# Patient Record
Sex: Female | Born: 1950
Health system: Southern US, Community
[De-identification: ages and names within clinical notes are randomized; demographics above are authoritative.]

## PROBLEM LIST (undated history)

## (undated) DIAGNOSIS — Z8619 Personal history of other infectious and parasitic diseases: Secondary | ICD-10-CM

## (undated) DIAGNOSIS — M1990 Unspecified osteoarthritis, unspecified site: Secondary | ICD-10-CM

## (undated) DIAGNOSIS — E039 Hypothyroidism, unspecified: Secondary | ICD-10-CM

## (undated) DIAGNOSIS — E049 Nontoxic goiter, unspecified: Secondary | ICD-10-CM

## (undated) DIAGNOSIS — I00 Rheumatic fever without heart involvement: Secondary | ICD-10-CM

## (undated) DIAGNOSIS — M7731 Calcaneal spur, right foot: Secondary | ICD-10-CM

## (undated) DIAGNOSIS — D2239 Melanocytic nevi of other parts of face: Secondary | ICD-10-CM

## (undated) DIAGNOSIS — G43909 Migraine, unspecified, not intractable, without status migrainosus: Secondary | ICD-10-CM

## (undated) DIAGNOSIS — H43819 Vitreous degeneration, unspecified eye: Secondary | ICD-10-CM

## (undated) DIAGNOSIS — R42 Dizziness and giddiness: Secondary | ICD-10-CM

## (undated) HISTORY — DX: Vitreous degeneration, unspecified eye: H43.819

## (undated) HISTORY — DX: Melanocytic nevi of other parts of face: D22.39

## (undated) HISTORY — DX: Personal history of other infectious and parasitic diseases: Z86.19

## (undated) HISTORY — PX: TONSILLECTOMY AND ADENOIDECTOMY: SUR1326

## (undated) HISTORY — DX: Hypothyroidism, unspecified: E03.9

## (undated) HISTORY — DX: Nontoxic goiter, unspecified: E04.9

## (undated) HISTORY — DX: Migraine, unspecified, not intractable, without status migrainosus: G43.909

## (undated) HISTORY — DX: Rheumatic fever without heart involvement: I00

---

## 2007-08-09 HISTORY — PX: COLONOSCOPY: SHX174

## 2014-08-08 DIAGNOSIS — D2239 Melanocytic nevi of other parts of face: Secondary | ICD-10-CM

## 2014-08-08 HISTORY — DX: Melanocytic nevi of other parts of face: D22.39

## 2014-08-15 ENCOUNTER — Encounter: Payer: Self-pay | Admitting: Internal Medicine

## 2014-08-15 ENCOUNTER — Ambulatory Visit (INDEPENDENT_AMBULATORY_CARE_PROVIDER_SITE_OTHER): Payer: BLUE CROSS/BLUE SHIELD | Admitting: Internal Medicine

## 2014-08-15 VITALS — BP 117/70 | HR 60 | Temp 98.0°F | Ht 64.0 in | Wt 148.1 lb

## 2014-08-15 DIAGNOSIS — G43809 Other migraine, not intractable, without status migrainosus: Secondary | ICD-10-CM

## 2014-08-15 DIAGNOSIS — M25561 Pain in right knee: Secondary | ICD-10-CM

## 2014-08-15 DIAGNOSIS — L989 Disorder of the skin and subcutaneous tissue, unspecified: Secondary | ICD-10-CM

## 2014-08-15 DIAGNOSIS — E039 Hypothyroidism, unspecified: Secondary | ICD-10-CM | POA: Insufficient documentation

## 2014-08-15 DIAGNOSIS — G43909 Migraine, unspecified, not intractable, without status migrainosus: Secondary | ICD-10-CM | POA: Insufficient documentation

## 2014-08-15 DIAGNOSIS — E049 Nontoxic goiter, unspecified: Secondary | ICD-10-CM

## 2014-08-15 HISTORY — DX: Nontoxic goiter, unspecified: E04.9

## 2014-08-15 NOTE — Progress Notes (Signed)
Pre visit review using our clinic review tool, if applicable. No additional management support is needed unless otherwise documented below in the visit note. 

## 2014-08-15 NOTE — Patient Instructions (Signed)
Get your blood work before you leave     Please come back to the office in 4- 6 months for a   physical exam. Come back fasting     Will wait for your records

## 2014-08-15 NOTE — Progress Notes (Signed)
Subjective:    Patient ID: Shannon Mendez, female    DOB: Dec 21, 1950, 64 y.o.   MRN: [id number]  DOS:  08/15/2014 Type of visit - description : New patient, here to get established We discussed the following issues today: Has a skin lesion at the forehead, last time was seen by dermatology was about 2 years, since then they area is darker, itchy, bleeds from time to time. Hypothyroidism, good medication compliance One-year history of on and off right leg pain, pain is located at the external aspect of the knee and goes up more to the inner aspect of the knee. Worse with walking, worse with the abduction of the hip. Denies any injury, swelling, redness or warmness History of goiter, see assessment and plan  ROS Denies chest pain, difficulty breathing or lower extremity edema No back pain per se, no pain at the left leg. Denies dysuria, gross hematuria difficulty urinating  Past Medical History  Diagnosis Date  . Migraine   . Rheumatic fever     as a child  . Hypothyroid   . History of chicken pox   . Goiter 08/15/2014    Past Surgical History  Procedure Laterality Date  . Tonsillectomy and adenoidectomy      History   Social History  . Marital Status: Married    Spouse Name: N/A    Number of Children: 2  . Years of Education: N/A   Occupational History  . 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 Smoking status: Never Smoker   . Smokeless tobacco: Never Used  . Alcohol Use: 0.0 oz/week    0 Not specified per week     Comment: Occasional  . Drug Use: No  . Sexual Activity: Not on file   Other Topics Concern  . Not on file   Social History Narrative   Son 107, daughter 44 (she lives in [place])   High school education     Family History  Problem Relation Age of Onset  . Colon cancer Neg Hx   . Cervical cancer Neg Hx   . Ovarian cancer Mother     in her 34s  . Breast cancer Neg Hx   . CAD Neg Hx   . Diabetes  Other     GM ?       Medication List       This list is accurate as of: 08/15/14 11:59 PM.  Always use your most recent med list.               levothyroxine 100 MCG tablet  Commonly known as:  SYNTHROID, LEVOTHROID  Take 100 mcg by mouth daily before breakfast.           Objective:   Physical Exam  HENT:  Head:    Musculoskeletal:       Legs:  BP 117/70 mmHg  Pulse 60  Temp(Src) 98 F (36.7 C) (Oral)  Ht 5\' 4"  (1.626 m)  Wt 148 lb 2 oz (67.189 kg)  BMI 25.41 kg/m2  SpO2 97% General -- alert, well-developed, NAD.  Neck --+ thyromegaly, gland is symmetric, not nodular or tender HEENT-- Not pale.   Lungs -- normal respiratory effort, no intercostal retractions, no accessory muscle use, and normal breath sounds.  Heart-- normal rate, regular rhythm, no murmur.  Abdomen-- Not distended, good bowel sounds,soft, non-tender. Extremities-- no pretibial edema bilaterally  Knees without swelling, deformities, effusion.  DTRs symmetric and stable. Mild pain at the external aspect of the right knee, mild pain with   abduction of the hip. Neurologic--  alert & oriented X3. Speech normal, gait appropriate for age, strength symmetric and appropriate for age.  Psych-- Cognition and judgment appear intact. Cooperative with normal attention span and concentration. No anxious or depressed appearing.        Assessment & Plan:   Declined a flu shot We'll get records from previous doctors Reports a Pap smear April 2015, + Abnormal Paps before Last mammogram 2015 Previous PCP Dr. Reece Leader in [place] Used to see gynecology

## 2014-08-15 NOTE — Assessment & Plan Note (Signed)
Suspect intrinsic knee problem, meniscal problem? Refer to orthopedic

## 2014-08-15 NOTE — Assessment & Plan Note (Signed)
Long history of goiter, had 2 ultrasounds before and a biopsy which was negative approximately 2011. Plan-- get records.

## 2014-08-15 NOTE — Assessment & Plan Note (Signed)
Continue with Synthroid 100 g, last TSH was approximately 5 months ago, check a TSH, refill as needed

## 2014-08-16 LAB — TSH: TSH: 0.295 u[IU]/mL — ABNORMAL LOW (ref 0.350–4.500)

## 2014-08-16 NOTE — Assessment & Plan Note (Signed)
Skin lesion getting worse over the last 2 years, suspect SK, refer to dermatology for confirmation

## 2014-08-18 MED ORDER — LEVOTHYROXINE SODIUM 88 MCG PO TABS: 88.0000 ug | ORAL_TABLET | Freq: Every day | ORAL | Status: DC

## 2014-08-18 NOTE — Addendum Note (Signed)
Addended by: Wilfrid Lund on: 08/18/2014 03:15 PM   Modules accepted: Orders, Medications

## 2014-09-12 ENCOUNTER — Telehealth: Payer: Self-pay | Admitting: *Deleted

## 2014-09-12 NOTE — Telephone Encounter (Signed)
Received medical records via fax from Dr. Reece Leader. Forwarded to Dr. Larose Kells. JG//CMA

## 2014-09-22 NOTE — Telephone Encounter (Signed)
Records reviewed,only relevant ones will be kept, the rest are going back to the patient for safe keeping. -History of posterior vitreous detachment in 2011 -EKGs scanned  -labs 06-24-13: CBC normal potassium 4.5, creatinine 0.8. Total cholesterol 214, HDL 63, LDL 136. - Per chart review: Colonoscopy 01/25/2008, no actual report ,  next 2016  -History of goiter, used to see endocrinology yearly

## 2014-10-17 ENCOUNTER — Other Ambulatory Visit: Payer: Self-pay

## 2014-10-17 MED ORDER — LEVOTHYROXINE SODIUM 88 MCG PO TABS: 88.0000 ug | ORAL_TABLET | Freq: Every day | ORAL | Status: DC

## 2014-11-20 ENCOUNTER — Ambulatory Visit (INDEPENDENT_AMBULATORY_CARE_PROVIDER_SITE_OTHER): Payer: BLUE CROSS/BLUE SHIELD | Admitting: Internal Medicine

## 2014-11-20 ENCOUNTER — Other Ambulatory Visit: Payer: Self-pay

## 2014-11-20 ENCOUNTER — Encounter: Payer: Self-pay | Admitting: Internal Medicine

## 2014-11-20 VITALS — BP 118/78 | HR 64 | Temp 98.0°F | Ht 64.0 in | Wt 151.5 lb

## 2014-11-20 DIAGNOSIS — M25561 Pain in right knee: Secondary | ICD-10-CM

## 2014-11-20 DIAGNOSIS — L989 Disorder of the skin and subcutaneous tissue, unspecified: Secondary | ICD-10-CM | POA: Diagnosis not present

## 2014-11-20 DIAGNOSIS — E049 Nontoxic goiter, unspecified: Secondary | ICD-10-CM | POA: Diagnosis not present

## 2014-11-20 DIAGNOSIS — E039 Hypothyroidism, unspecified: Secondary | ICD-10-CM

## 2014-11-20 DIAGNOSIS — Z0000 Encounter for general adult medical examination without abnormal findings: Secondary | ICD-10-CM

## 2014-11-20 NOTE — Progress Notes (Signed)
Pre visit review using our clinic review tool, if applicable. No additional management support is needed unless otherwise documented below in the visit note. 

## 2014-11-20 NOTE — Patient Instructions (Signed)
Get your blood work before you leave    Come back to the office 6 months  for a physical exam  Please schedule an appointment at the front desk    Come back fasting

## 2014-11-20 NOTE — Progress Notes (Signed)
Subjective:    Patient ID: Shannon Mendez, female    DOB: 05-Feb-1951, 64 y.o.   MRN: [id number]  DOS:  11/20/2014 Type of visit - description : rov Interval history: Hypothyroidism, based on the last TSH, Synthroid dose adjusted, good compliance. Was seen with knee pain, doing better Was seen recently with skin lesion, better.    Review of Systems In general feeling well, denies any pain or swelling at the thyroid area. She has goiter, the area doesn't seem any different than before. Previous records from her other doctor reviewed and discuss with the patient today.  Past Medical History  Diagnosis Date  . Migraine   . Rheumatic fever     as a child  . Hypothyroid   . History of chicken pox   . Goiter 08/15/2014  . Vitreous detachment     2011    Past Surgical History  Procedure Laterality Date  . Tonsillectomy and adenoidectomy      History   Social History  . Marital Status: Married    Spouse Name: N/A  . Number of Children: 2  . Years of Education: N/A   Occupational History  . 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 Smoking status: Never Smoker   . Smokeless tobacco: Never Used  . Alcohol Use: 0.0 oz/week    0 Standard drinks or equivalent per week     Comment: Occasional  . Drug Use: No  . Sexual Activity: Not on file   Other Topics Concern  . Not on file   Social History Narrative   Son 70, daughter 80 (she lives in [place])   High school education        Medication List       This list is accurate as of: 11/20/14 11:59 PM.  Always use your most recent med list.               levothyroxine 88 MCG tablet  Commonly known as:  SYNTHROID  Take 1 tablet (88 mcg total) by mouth daily before breakfast.     meloxicam 15 MG tablet  Commonly known as:  MOBIC  Take 15 mg by mouth daily as needed for pain. Dr. Marjo Bicker           Objective:   Physical Exam BP 118/78 mmHg  Pulse 64  Temp(Src)  98 F (36.7 C) (Oral)  Ht 5\' 4"  (1.626 m)  Wt 151 lb 8 oz (68.72 kg)  BMI 25.99 kg/m2  SpO2 98% General:   Well developed, well nourished . NAD.  HEENT:  Normocephalic . Face symmetric, atraumatic Skin forehead: Normal Neck: Thyroid gland is slightly enlarge, worse on the right? Not nodular or tender Lungs:  CTA B Normal respiratory effort, no intercostal retractions, no accessory muscle use. Heart: RRR,  no murmur.  Muscle skeletal: no pretibial edema bilaterally  Skin: Not pale. Not jaundice Neurologic:  alert & oriented X3.  Speech normal, gait appropriate for age and unassisted Psych--  Cognition and judgment appear intact.  Cooperative with normal attention span and concentration.  Behavior appropriate. No anxious or depressed appearing.      Assessment & Plan:    Records reviewed,only relevant ones will be kept, the rest are going back to the patient for safe keeping. -History of posterior vitreous detachment in 2011 -EKGs scanned  -labs 06-24-13: CBC normal potassium 4.5, creatinine 0.8. Total cholesterol 214, HDL 63, LDL  136. - Per chart review: Colonoscopy 01/25/2008, no actual report , next 2016  -History of goiter, used to see endocrinology year Skin lesion, the patient cancelled dermatology referral because the lesion fell off. On exam area is now completely norma

## 2014-11-21 DIAGNOSIS — Z0000 Encounter for general adult medical examination without abnormal findings: Secondary | ICD-10-CM | POA: Insufficient documentation

## 2014-11-21 LAB — TSH: TSH: 0.46 u[IU]/mL (ref 0.35–4.50)

## 2014-11-21 NOTE — Assessment & Plan Note (Signed)
will refer to a female gynecologist Will discuss a colonoscopy when she comes back in 6 months

## 2014-11-21 NOTE — Assessment & Plan Note (Signed)
Saw orthopedic doctor, had few meloxicam, feeling better

## 2014-11-21 NOTE — Assessment & Plan Note (Signed)
Hypothyroidism She has a history of hypothyroidism and also goiter. Had 1 biopsy of the thyroid before and he was negative for malignancy. Status post approximately 3 ultrasounds, the last 1 was 3 years ago. We agreed to check a ultrasound

## 2014-11-21 NOTE — Assessment & Plan Note (Signed)
H/o  1 biopsy of the thyroid before and he was negative for malignancy. Status post approximately 3 ultrasounds, the last ~  3 years ago. We agreed to check a ultrasound

## 2014-11-21 NOTE — Assessment & Plan Note (Signed)
Skin lesion, the patient cancelled dermatology referral because the lesion fell off. On exam area is now completely normal

## 2014-11-24 MED ORDER — LEVOTHYROXINE SODIUM 88 MCG PO TABS: 88.0000 ug | ORAL_TABLET | Freq: Every day | ORAL | Status: DC

## 2014-11-24 NOTE — Addendum Note (Signed)
Addended by: Wilfrid Lund on: 11/24/2014 08:05 AM   Modules accepted: Orders

## 2014-11-25 ENCOUNTER — Ambulatory Visit (HOSPITAL_BASED_OUTPATIENT_CLINIC_OR_DEPARTMENT_OTHER)
Admission: RE | Admit: 2014-11-25 | Discharge: 2014-11-25 | Disposition: A | Discharge status: Home / Self Care | Payer: BLUE CROSS/BLUE SHIELD | Source: Ambulatory Visit | Attending: Internal Medicine | Admitting: Internal Medicine

## 2014-11-25 DIAGNOSIS — E049 Nontoxic goiter, unspecified: Secondary | ICD-10-CM

## 2014-12-19 ENCOUNTER — Telehealth: Payer: Self-pay | Admitting: Internal Medicine

## 2014-12-19 DIAGNOSIS — Z1211 Encounter for screening for malignant neoplasm of colon: Secondary | ICD-10-CM

## 2014-12-19 NOTE — Telephone Encounter (Signed)
Relation to pt: self  Call back number: [phone]   Reason for call:  Pt requesting orders for colonoscopy

## 2014-12-19 NOTE — Telephone Encounter (Signed)
Okay to place orders 

## 2014-12-19 NOTE — Telephone Encounter (Signed)
Referral placed to GI 

## 2014-12-19 NOTE — Telephone Encounter (Signed)
Please arrange a referral to [facility] for a colonoscopy, she is due this year

## 2014-12-29 LAB — HM PAP SMEAR

## 2015-01-01 ENCOUNTER — Telehealth: Payer: Self-pay | Admitting: Internal Medicine

## 2015-01-01 NOTE — Telephone Encounter (Signed)
Caller name: Neesha Relation to pt: self  Call back number: [phone] Pharmacy:  Reason for call:   Patient states that the GI office that we referred her to is requesting Colonoscopy report and images. She states that Dr. Larose Kells has told her that he did receive records but I don't see anything in chart. Does Dr. Larose Kells has these paper records so that I can send to GI. Thanks!

## 2015-01-02 NOTE — Telephone Encounter (Signed)
Per Dr. Larose Kells, he does not have cscope report. If GI needs it, Pt will need to supply it to them from whomever previously did her cscope.

## 2015-01-02 NOTE — Telephone Encounter (Signed)
Please advise 

## 2015-01-02 NOTE — Telephone Encounter (Signed)
See my note from 07-2015: "Per chart review: Colonoscopy 01/25/2008, no actual report , next 2016"  I know she had a colonoscopy but I don't have the actual report. If the actual report is needed we need to contact the GI doc directly

## 2015-01-07 ENCOUNTER — Telehealth: Payer: Self-pay | Admitting: Gastroenterology

## 2015-01-07 NOTE — Telephone Encounter (Signed)
Reviewed colonoscopy report from 01/2008 in [place];  Dr. Almeta Monas.  The report is half hand-written, half typed.  No pictures.  He documented the test was done for "screening" and "chronic constipation" and "pink or red material on toilet paper."  Exam was to the cecum, prep was good.  No polyps were found, she did have hemorrhoids. He recommended repeat colonoscopy in 6 years.  Very unusual interval for repeat examination.  Unless new symptoms have arisen, she is OK for colon cancer screening again in 01/2018 (usual ten year interval).

## 2015-01-08 ENCOUNTER — Telehealth: Payer: Self-pay | Admitting: Internal Medicine

## 2015-01-08 NOTE — Telephone Encounter (Signed)
Noted  

## 2015-01-08 NOTE — Telephone Encounter (Signed)
Caller name: Asa Relation to pt: self Call back number: Pharmacy:  Reason for call:   Patient states that she heard from GI and that they told her that she will be due for a colonoscopy 2019. Just FYI

## 2015-01-19 ENCOUNTER — Telehealth: Payer: Self-pay | Admitting: *Deleted

## 2015-01-19 NOTE — Telephone Encounter (Signed)
Unable to reach patient at time of Pre-Visit Call.  Unable to leave message because phone did not connect to voicemail- rang continuously.

## 2015-01-20 ENCOUNTER — Encounter: Payer: Self-pay | Admitting: Internal Medicine

## 2015-01-20 ENCOUNTER — Ambulatory Visit (INDEPENDENT_AMBULATORY_CARE_PROVIDER_SITE_OTHER): Payer: BLUE CROSS/BLUE SHIELD | Admitting: Internal Medicine

## 2015-01-20 VITALS — BP 126/70 | HR 57 | Temp 98.0°F | Ht 64.0 in | Wt 150.5 lb

## 2015-01-20 DIAGNOSIS — Z23 Encounter for immunization: Secondary | ICD-10-CM

## 2015-01-20 DIAGNOSIS — E049 Nontoxic goiter, unspecified: Secondary | ICD-10-CM

## 2015-01-20 DIAGNOSIS — Z0000 Encounter for general adult medical examination without abnormal findings: Secondary | ICD-10-CM

## 2015-01-20 NOTE — Patient Instructions (Signed)
  Please schedule labs to be done within few days (fasting)   

## 2015-01-20 NOTE — Progress Notes (Signed)
Subjective:    Patient ID: Shannon Mendez, female    DOB: 01-30-1951, 64 y.o.   MRN: [id number]  DOS:  01/20/2015 Type of visit - description : cpx Interval history:  Patient is a 64 year old female with history of hypothyroidism in today for routine medical care.  Hypothyroidism: Patient notes good compliance with medications and denies any symptoms of hypo-/hyperthyroidism. Denies side effects from medications.  Knee Pain: Currently taking Mobic prn for knee pain with good relief. No side effects from medication.   Dizziness: Notes occasional episodes of tunnel vision without syncope usually in the context of several hours of looking at the computer monitor. Denies vertigo. Is not accompanied by any other symptoms like blurred vision or constitutional symptoms like sweating/chills/fever  Review of Systems  Constitutional: No fever. No chills. No unexplained wt changes. No unusual sweats  HEENT: No dental problems, no ear discharge, no facial swelling, no voice changes. No eye discharge, no eye  redness , no  intolerance to light   Respiratory: No wheezing, no difficulty breathing. No cough, no mucus production  Cardiovascular: No CP, no leg swelling , no  Palpitations  GI: no nausea, no vomiting, no diarrhea , no  abdominal pain.  No blood in the stools. No dysphagia, no odynophagia    Endocrine: No polyphagia, no polyuria , no polydipsia  GU: No dysuria, gross hematuria, difficulty urinating. No urinary urgency, no frequency.  Musculoskeletal: No joint swellings or unusual aches or pains  Skin: No change in the color of the skin, pallor, no rash  Allergic, immunologic: No environmental allergies, no food allergies  Neurological: No syncope. No headaches. No diplopia, no slurred, no slurred speech, no motor deficits, no facial numbness. Occ dizziness.  Hematological: No enlarged lymph nodes, no easy bruising , no unusual bleedings  Psychiatry: No suicidal ideas, no  hallucinations, no beavior problems, no confusion.  No unusual/severe anxiety, no depression    Past Medical History  Diagnosis Date  . Migraine   . Rheumatic fever     as a child  . Hypothyroid   . History of chicken pox   . Goiter 08/15/2014  . Vitreous detachment     2011    Past Surgical History  Procedure Laterality Date  . Tonsillectomy and adenoidectomy      History   Social History  . Marital Status: Married    Spouse Name: N/A  . Number of Children: 2  . Years of Education: N/A   Occupational History  . 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 Smoking status: Never Smoker   . Smokeless tobacco: Never Used  . Alcohol Use: 0.0 oz/week    0 Standard drinks or equivalent per week     Comment: Occasional  . Drug Use: No  . Sexual Activity: Not on file   Other Topics Concern  . Not on file   Social History Narrative   Son 84, daughter 25 (she lives in [place])   High school education     Family History  Problem Relation Age of Onset  . Colon cancer Neg Hx   . Cervical cancer Neg Hx   . Ovarian cancer Mother     in her 36s  . Breast cancer Neg Hx   . CAD Neg Hx   . Diabetes Other     GM ?      Medication List  This list is accurate as of: 01/20/15 11:59 PM.  Always use your most recent med list.               levothyroxine 88 MCG tablet  Commonly known as:  SYNTHROID  Take 1 tablet (88 mcg total) by mouth daily before breakfast.     meloxicam 15 MG tablet  Commonly known as:  MOBIC  Take 15 mg by mouth daily as needed for pain. Dr. Marjo Bicker           Objective:   Physical Exam BP 126/70 mmHg  Pulse 57  Temp(Src) 98 F (36.7 C) (Oral)  Ht 5\' 4"  (1.626 m)  Wt 150 lb 8 oz (68.266 kg)  BMI 25.82 kg/m2  SpO2 97%  General:   Well developed, well nourished . NAD.  Neck:  Full range of motion. Supple. HEENT:  Normocephalic. Face symmetric, atraumatic. Thyromegaly noted. Lungs:   CTA B Normal respiratory effort, no intercostal retractions, no accessory muscle use. Heart: RRR,  no murmur.  No pretibial edema bilaterally  Abdomen:  Not distended, soft, non-tender. No rebound or rigidity. No mass,organomegaly Skin: Exposed areas without rash. Not pale. Not jaundice Neurologic:  alert & oriented X3.  Speech normal, gait appropriate for age and unassisted Psych: Cognition and judgment appear intact.  Cooperative with normal attention span and concentration.  Behavior appropriate. No anxious or depressed appearing.     Assessment & Plan:   (Patient seen along with   Aletta Edouard, medical student)  Hypothyroidism: Well-controlled with synthroid, continue current regimen of medication. Will check labs today.  Knee Pain: Well-controlled with Meloxicam prn, patient to let us know if symptoms worsen.  Dizziness:  Patient notes occasional episodes of tunnel vision which are not accompanied by vertigo, syncope, or double vision. These occur most often while she is sitting at chair. Plan: Will observe condition, patient instructed to let us know if symptoms become more frequent or severe.

## 2015-01-20 NOTE — Progress Notes (Signed)
Pre visit review using our clinic review tool, if applicable. No additional management support is needed unless otherwise documented below in the visit note. 

## 2015-01-21 NOTE — Assessment & Plan Note (Signed)
Tetanus today EKG today normal Cscope 2009, no polyps, next cscope due 2019  Saw gyn - had pap smear (-) , is scheduled to return 01/28/15 for MMG and BMD  Labs - BMP/LFTs/FLP/CBC/TSH Diet and exercise discussed

## 2015-01-21 NOTE — Assessment & Plan Note (Signed)
Ultrasound 11/2014 stable 

## 2015-01-22 ENCOUNTER — Other Ambulatory Visit (INDEPENDENT_AMBULATORY_CARE_PROVIDER_SITE_OTHER): Payer: BLUE CROSS/BLUE SHIELD

## 2015-01-22 DIAGNOSIS — Z0000 Encounter for general adult medical examination without abnormal findings: Secondary | ICD-10-CM

## 2015-01-22 LAB — CBC WITH DIFFERENTIAL/PLATELET
BASOS PCT: 0.8 % (ref 0.0–3.0)
Basophils Absolute: 0.1 10*3/uL (ref 0.0–0.1)
EOS PCT: 1.3 % (ref 0.0–5.0)
Eosinophils Absolute: 0.1 10*3/uL (ref 0.0–0.7)
HCT: 40.7 % (ref 36.0–46.0)
Hemoglobin: 13.9 g/dL (ref 12.0–15.0)
LYMPHS ABS: 1.9 10*3/uL (ref 0.7–4.0)
Lymphocytes Relative: 25.5 % (ref 12.0–46.0)
MCHC: 34.1 g/dL (ref 30.0–36.0)
MCV: 90.4 fl (ref 78.0–100.0)
MONO ABS: 0.6 10*3/uL (ref 0.1–1.0)
MONOS PCT: 8.5 % (ref 3.0–12.0)
NEUTROS ABS: 4.8 10*3/uL (ref 1.4–7.7)
Neutrophils Relative %: 63.9 % (ref 43.0–77.0)
PLATELETS: 247 10*3/uL (ref 150.0–400.0)
RBC: 4.5 Mil/uL (ref 3.87–5.11)
RDW: 12.9 % (ref 11.5–15.5)
WBC: 7.5 10*3/uL (ref 4.0–10.5)

## 2015-01-22 LAB — COMPREHENSIVE METABOLIC PANEL
ALT: 21 U/L (ref 0–35)
AST: 20 U/L (ref 0–37)
Albumin: 4.1 g/dL (ref 3.5–5.2)
Alkaline Phosphatase: 98 U/L (ref 39–117)
BILIRUBIN TOTAL: 0.6 mg/dL (ref 0.2–1.2)
BUN: 18 mg/dL (ref 6–23)
CO2: 28 mEq/L (ref 19–32)
Calcium: 9.1 mg/dL (ref 8.4–10.5)
Chloride: 107 mEq/L (ref 96–112)
Creatinine, Ser: 0.96 mg/dL (ref 0.40–1.20)
GFR: 62.47 mL/min (ref >60.00)
Glucose, Bld: 99 mg/dL (ref 70–99)
Potassium: 4.4 mEq/L (ref 3.5–5.1)
Sodium: 140 mEq/L (ref 135–145)
Total Protein: 6.7 g/dL (ref 6.0–8.3)

## 2015-01-22 LAB — LIPID PANEL
Cholesterol: 213 mg/dL — ABNORMAL HIGH (ref 0–200)
HDL: 47.1 mg/dL (ref >39.00)
LDL Cholesterol: 137 mg/dL — ABNORMAL HIGH (ref 0–99)
NONHDL: 165.9
Total CHOL/HDL Ratio: 5
Triglycerides: 143 mg/dL (ref 0.0–149.0)
VLDL: 28.6 mg/dL (ref 0.0–40.0)

## 2015-01-22 LAB — TSH: TSH: 0.65 u[IU]/mL (ref 0.35–4.50)

## 2015-05-27 ENCOUNTER — Encounter: Payer: BLUE CROSS/BLUE SHIELD | Admitting: Internal Medicine

## 2015-06-13 ENCOUNTER — Other Ambulatory Visit: Payer: Self-pay | Admitting: Internal Medicine

## 2015-06-17 ENCOUNTER — Telehealth: Payer: Self-pay | Admitting: Internal Medicine

## 2015-06-17 MED ORDER — LEVOTHYROXINE SODIUM 88 MCG PO TABS: 88.0000 ug | ORAL_TABLET | Freq: Every day | ORAL | Status: DC

## 2015-06-17 NOTE — Telephone Encounter (Signed)
Rx sent to [facility] on [address], #90 and 2 refills.

## 2015-06-17 NOTE — Telephone Encounter (Signed)
°  Relation to YO:FVWA Call back number:[phone] Pharmacy:[facility]  Reason for call: pt states per her last conversation with dr.paz she explained to him that she no longer has coverage and she would still need to get her thyroid meds, pt has switched to [facility] because its cheaper, and need dr. Larose Kells to send in her rx levothyroxine (SYNTHROID, LEVOTHROID) 88 MCG tablet pt is requesting a 90 day supply.

## 2016-07-04 ENCOUNTER — Telehealth: Payer: Self-pay | Admitting: Internal Medicine

## 2016-07-04 DIAGNOSIS — E039 Hypothyroidism, unspecified: Secondary | ICD-10-CM

## 2016-07-04 MED ORDER — LEVOTHYROXINE SODIUM 88 MCG PO TABS: 88.0000 ug | ORAL_TABLET | Freq: Every day | ORAL | 0 refills | Status: DC

## 2016-07-04 NOTE — Telephone Encounter (Signed)
Caller name: Leoni  Relation to pt: self  Call back number: [phone] Pharmacy: [place], [place]  Reason for call: Pt is needing refill on levothyroxine (SYNTHROID, LEVOTHROID) 88 MCG tablet. Pt states will set up a CPE appt for next year since she is waiting in having insurance for the visit. Please advise.

## 2016-07-04 NOTE — Telephone Encounter (Signed)
30 day supply sent. Pt at least needs to come in and have TSH checked for correct dosage. Last TSH was checked 01/2015.

## 2016-09-19 ENCOUNTER — Encounter: Payer: Self-pay | Admitting: Internal Medicine

## 2016-09-19 ENCOUNTER — Ambulatory Visit (INDEPENDENT_AMBULATORY_CARE_PROVIDER_SITE_OTHER): Payer: Self-pay | Admitting: Internal Medicine

## 2016-09-19 VITALS — BP 108/68 | HR 58 | Temp 97.8°F | Resp 12 | Ht 64.0 in | Wt 153.1 lb

## 2016-09-19 DIAGNOSIS — Z09 Encounter for follow-up examination after completed treatment for conditions other than malignant neoplasm: Secondary | ICD-10-CM | POA: Insufficient documentation

## 2016-09-19 DIAGNOSIS — E039 Hypothyroidism, unspecified: Secondary | ICD-10-CM

## 2016-09-19 LAB — TSH: TSH: 3.06 u[IU]/mL (ref 0.35–4.50)

## 2016-09-19 MED ORDER — LEVOTHYROXINE SODIUM 88 MCG PO TABS: 88.0000 ug | ORAL_TABLET | Freq: Every day | ORAL | 5 refills | Status: DC

## 2016-09-19 NOTE — Assessment & Plan Note (Signed)
Hypothyroidism: Refill medications, check a TSH Goiter: Stable clinically. Will get a ultrasound when she has insurance. See below. Patient reports has no insurance reason why she has not been able to come regularly, will get coverage in few months and plans to come back for a CPX. For now request to limit labs to only the strictly necessary RTC CPX @ her convenience

## 2016-09-19 NOTE — Progress Notes (Signed)
Pre visit review using our clinic review tool, if applicable. No additional management support is needed unless otherwise documented below in the visit note. 

## 2016-09-19 NOTE — Patient Instructions (Signed)
GO TO THE LAB : Get the blood work     GO TO THE FRONT DESK Schedule your next appointment for a  Physical at your earliest convenience

## 2016-09-19 NOTE — Progress Notes (Signed)
Subjective:    Patient ID: Shannon Mendez, female    DOB: 1951-06-06, 66 y.o.   MRN: [id number]  DOS:  09/19/2016 Type of visit - description : rov Interval history: Hypothyroidism: Good compliance of medications, due for a TSH and refills Goiter: Reports self-examination is unchanged   Review of Systems  Doing great, no nausea, vomiting, diarrhea No chest pain or difficulty breathing  Past Medical History:  Diagnosis Date  . Fibrous papule of nose 2016   Benign  . Goiter 08/15/2014  . History of chicken pox   . Hypothyroid   . Migraine   . Rheumatic fever    as a child  . Vitreous detachment    2011    Past Surgical History:  Procedure Laterality Date  . TONSILLECTOMY AND ADENOIDECTOMY      Social History   Social History  . Marital status: Married    Spouse name: N/A  . Number of children: 2  . Years of education: N/A   Occupational History  . 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 Smoking status: Never Smoker  . Smokeless tobacco: Never Used  . Alcohol use 0.0 oz/week     Comment: Occasional  . Drug use: No  . Sexual activity: Not on file   Other Topics Concern  . Not on file   Social History Narrative   Son 36, daughter 75 (she lives in [place])   High school education      Allergies as of 09/19/2016      Reactions   Amoxicillin Hives      Medication List       Accurate as of 09/19/16  9:25 PM. Always use your most recent med list.          levothyroxine 88 MCG tablet Commonly known as:  SYNTHROID, LEVOTHROID Take 1 tablet (88 mcg total) by mouth daily before breakfast.   meloxicam 15 MG tablet Commonly known as:  MOBIC Take 15 mg by mouth daily as needed for pain. Dr. Marjo Bicker          Objective:   Physical Exam BP 108/68 (BP Location: Left Arm, Patient Position: Sitting, Cuff Size: Small)   Pulse (!) 58   Temp 97.8 F (36.6 C) (Oral)   Resp 12   Ht 5\' 4"  (1.626 m)   Wt  153 lb 2 oz (69.5 kg)   SpO2 99%   BMI 26.28 kg/m  General:   Well developed, well nourished . NAD.  HEENT:  Normocephalic . Face symmetric, atraumatic Neck: + Thyromegaly, more noticeable on the right, not nodular or tender. Lungs:  CTA B Normal respiratory effort, no intercostal retractions, no accessory muscle use. Heart: RRR,  no murmur.  No pretibial edema bilaterally  Skin: Not pale. Not jaundice Neurologic:  alert & oriented X3.  Speech normal, gait appropriate for age and unassisted Psych--  Cognition and judgment appear intact.  Cooperative with normal attention span and concentration.  Behavior appropriate. No anxious or depressed appearing.      Assessment & Plan:   Assessment Hypothyroidism Goiter: s/p USs in [place], last ~ 2013, s/p bx ~ 2011 (-) h/o migraines Vitreous detachment 2011 Rheumatic fever as a child  PLAN: Hypothyroidism: Refill medications, check a TSH Goiter: Stable clinically. Will get a ultrasound when she has insurance. See below. Patient reports has no insurance reason why she has not been able to come regularly, will  get coverage in few months and plans to come back for a CPX. For now request to limit labs to only the strictly necessary RTC CPX @ her convenience II

## 2017-01-19 ENCOUNTER — Encounter: Payer: Self-pay | Admitting: Family Medicine

## 2017-01-19 ENCOUNTER — Ambulatory Visit (INDEPENDENT_AMBULATORY_CARE_PROVIDER_SITE_OTHER): Payer: Self-pay | Admitting: Family Medicine

## 2017-01-19 VITALS — BP 108/60 | HR 60 | Temp 98.1°F | Ht 64.0 in | Wt 149.6 lb

## 2017-01-19 DIAGNOSIS — R350 Frequency of micturition: Secondary | ICD-10-CM

## 2017-01-19 DIAGNOSIS — N3001 Acute cystitis with hematuria: Secondary | ICD-10-CM

## 2017-01-19 LAB — POC URINALSYSI DIPSTICK (AUTOMATED)
Bilirubin, UA: NEGATIVE
Glucose, UA: NEGATIVE
KETONES UA: NEGATIVE
Nitrite, UA: NEGATIVE
PH UA: 6 (ref 5.0–8.0)
PROTEIN UA: NEGATIVE
Spec Grav, UA: >=1.03 — AB (ref 1.010–1.025)
Urobilinogen, UA: 0.2 E.U./dL

## 2017-01-19 MED ORDER — SULFAMETHOXAZOLE-TRIMETHOPRIM 800-160 MG PO TABS: 1.0000 | ORAL_TABLET | Freq: Two times a day (BID) | ORAL | 0 refills | Status: AC

## 2017-01-19 NOTE — Progress Notes (Addendum)
Chief Complaint  Patient presents with  . Urinary Tract Infection    freq urination,burning after urination-sxs 3 days    Shannon Mendez is a 66 y.o. female here for possible UTI.  Duration: 3 days. Symptoms: urinary frequency and dysuria Denies: hematuria, urinary hesitancy, fever, nausea and vomiting, vaginal discharge Hx of recurrent UTI? No  50% improvement since increasing H20 intake.  Denies new sexual partners.   ROS:  Constitutional: denies fever GU: As noted in HPI MSK: Denies back pain Abd: Denies constipation or abdominal pain  Past Medical History:  Diagnosis Date  . Fibrous papule of nose 2016   Benign  . Goiter 08/15/2014  . History of chicken pox   . Hypothyroid   . Migraine   . Rheumatic fever    as a child  . Vitreous detachment    2011   Family History  Problem Relation Age of Onset  . Ovarian cancer Mother        in her 62s  . Diabetes Other        GM ?  Marland Kitchen Colon cancer Neg Hx   . Cervical cancer Neg Hx   . Breast cancer Neg Hx   . CAD Neg Hx    Social History   Social History  . Marital status: Married   Occupational History  . 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 Smoking status: Never Smoker  . Smokeless tobacco: Never Used  . Alcohol use 0.0 oz/week     Comment: Occasional  . Drug use: No   Social History Narrative   Son 23, daughter 22 (she lives in [place])   High school education    BP 108/60 (BP Location: Left Arm, Patient Position: Sitting, Cuff Size: Normal)   Pulse 60   Temp 98.1 F (36.7 C) (Oral)   Ht 5\' 4"  (1.626 m)   Wt 149 lb 9.6 oz (67.9 kg)   SpO2 99%   BMI 25.68 kg/m  General: Awake, alert, appears stated age HEENT: MMM Heart: RRR Lungs: CTAB, normal respiratory effort, no accessory muscle usage Abd: BS+, soft, NT, ND, no masses or organomegaly MSK: No CVA tenderness, neg Lloyd's sign Psych: Age appropriate judgment and insight  Acute cystitis with  hematuria - Plan: sulfamethoxazole-trimethoprim (BACTRIM DS) 800-160 MG tablet  Frequency of urination - Plan: POCT Urinalysis Dipstick (Automated)  Orders as above. UA showed trace leukocyte esterase, blood. May be clearing issues on own. As she is 50% better, will continue to monitor. If she plateaus or worsens, take abx. Culture sent.  Immediate care if she starts having N/V, flank pain, or fevers. F/u prn. The patient voiced understanding and agreement to the plan.  [place], DO 01/19/17 3:13 PM

## 2017-01-19 NOTE — Addendum Note (Signed)
Addended by: Harl Bowie on: 01/19/2017 04:34 PM   Modules accepted: Orders

## 2017-01-19 NOTE — Patient Instructions (Signed)
Continue to stay well hydrated.  OK to hold off on antibiotic if you continue to improve.   It has been sent in should you plateau or worsen.  Seek care if you start having nausea, vomiting, fevers, or flank pain.

## 2017-01-20 LAB — URINE CULTURE

## 2017-01-24 ENCOUNTER — Telehealth: Payer: Self-pay | Admitting: Internal Medicine

## 2017-01-24 NOTE — Telephone Encounter (Signed)
°  Relation to JK:QASU Call back [phone]   Reason for call:  Patient inquired regarding lab results taken 01/19/17, informed patient   Notes recorded by Shelda Pal, DO on 01/23/2017 at 7:21 AM EDT Let ptk now her culture came back neg. If she picked up her rx a little later and is still taking abx, OK to stop. RTC if still having issues. TY.  Patient voice understanding, no additional questions.

## 2017-01-25 NOTE — Telephone Encounter (Signed)
Pt was in the office today and spoke with [facility] regarding her recent lab results and note.//AB/CMA

## 2017-07-21 DIAGNOSIS — Z1231 Encounter for screening mammogram for malignant neoplasm of breast: Secondary | ICD-10-CM | POA: Diagnosis not present

## 2017-07-21 DIAGNOSIS — Z124 Encounter for screening for malignant neoplasm of cervix: Secondary | ICD-10-CM | POA: Diagnosis not present

## 2017-07-21 DIAGNOSIS — Z6826 Body mass index (BMI) 26.0-26.9, adult: Secondary | ICD-10-CM | POA: Diagnosis not present

## 2017-07-24 LAB — HM PAP SMEAR

## 2017-08-18 ENCOUNTER — Encounter: Payer: Self-pay | Admitting: Internal Medicine

## 2017-08-18 ENCOUNTER — Ambulatory Visit (INDEPENDENT_AMBULATORY_CARE_PROVIDER_SITE_OTHER): Payer: Medicare HMO | Admitting: Internal Medicine

## 2017-08-18 VITALS — BP 124/68 | HR 58 | Temp 98.4°F | Resp 14 | Ht 64.0 in | Wt 151.1 lb

## 2017-08-18 DIAGNOSIS — Z0000 Encounter for general adult medical examination without abnormal findings: Secondary | ICD-10-CM | POA: Diagnosis not present

## 2017-08-18 DIAGNOSIS — Z780 Asymptomatic menopausal state: Secondary | ICD-10-CM

## 2017-08-18 DIAGNOSIS — E039 Hypothyroidism, unspecified: Secondary | ICD-10-CM | POA: Diagnosis not present

## 2017-08-18 DIAGNOSIS — Z114 Encounter for screening for human immunodeficiency virus [HIV]: Secondary | ICD-10-CM | POA: Diagnosis not present

## 2017-08-18 DIAGNOSIS — Z1159 Encounter for screening for other viral diseases: Secondary | ICD-10-CM | POA: Diagnosis not present

## 2017-08-18 DIAGNOSIS — R69 Illness, unspecified: Secondary | ICD-10-CM | POA: Diagnosis not present

## 2017-08-18 LAB — CBC WITH DIFFERENTIAL/PLATELET
BASOS PCT: 0.9 % (ref 0.0–3.0)
Basophils Absolute: 0.1 10*3/uL (ref 0.0–0.1)
EOS PCT: 1 % (ref 0.0–5.0)
Eosinophils Absolute: 0.1 10*3/uL (ref 0.0–0.7)
HCT: 42.6 % (ref 36.0–46.0)
Hemoglobin: 14.4 g/dL (ref 12.0–15.0)
LYMPHS ABS: 1.7 10*3/uL (ref 0.7–4.0)
Lymphocytes Relative: 28.1 % (ref 12.0–46.0)
MCHC: 33.8 g/dL (ref 30.0–36.0)
MCV: 93.4 fl (ref 78.0–100.0)
MONO ABS: 0.5 10*3/uL (ref 0.1–1.0)
Monocytes Relative: 7.4 % (ref 3.0–12.0)
NEUTROS ABS: 3.8 10*3/uL (ref 1.4–7.7)
Neutrophils Relative %: 62.6 % (ref 43.0–77.0)
PLATELETS: 253 10*3/uL (ref 150.0–400.0)
RBC: 4.56 Mil/uL (ref 3.87–5.11)
RDW: 13.3 % (ref 11.5–15.5)
WBC: 6.1 10*3/uL (ref 4.0–10.5)

## 2017-08-18 LAB — LIPID PANEL
Cholesterol: 219 mg/dL — ABNORMAL HIGH (ref 0–200)
HDL: 50.4 mg/dL (ref >39.00)
LDL CALC: 143 mg/dL — AB (ref 0–99)
NonHDL: 168.75
TRIGLYCERIDES: 127 mg/dL (ref 0.0–149.0)
Total CHOL/HDL Ratio: 4
VLDL: 25.4 mg/dL (ref 0.0–40.0)

## 2017-08-18 LAB — COMPREHENSIVE METABOLIC PANEL
ALT: 15 U/L (ref 0–35)
AST: 14 U/L (ref 0–37)
Albumin: 4.3 g/dL (ref 3.5–5.2)
Alkaline Phosphatase: 86 U/L (ref 39–117)
BUN: 15 mg/dL (ref 6–23)
CHLORIDE: 104 meq/L (ref 96–112)
CO2: 30 mEq/L (ref 19–32)
Calcium: 9 mg/dL (ref 8.4–10.5)
Creatinine, Ser: 0.87 mg/dL (ref 0.40–1.20)
GFR: 69.42 mL/min (ref >60.00)
Glucose, Bld: 93 mg/dL (ref 70–99)
POTASSIUM: 3.8 meq/L (ref 3.5–5.1)
SODIUM: 140 meq/L (ref 135–145)
Total Bilirubin: 1 mg/dL (ref 0.2–1.2)
Total Protein: 7.4 g/dL (ref 6.0–8.3)

## 2017-08-18 LAB — TSH: TSH: 6.39 u[IU]/mL — ABNORMAL HIGH (ref 0.35–4.50)

## 2017-08-18 NOTE — Patient Instructions (Signed)
GO TO THE LAB : Get the blood work     GO TO THE FRONT DESK Schedule your next appointment for a physical exam in 1 year We call you sooner just for blood work (thyroid)  If the headaches get more intense or frequent please call me.  For the mucus accumulation in the throat: Flonase 2 sprays on each side of the nose daily Omeprazole 20 mg OTC: 1 before breakfast Do that for 6 weeks, if you are not improving, let me know.

## 2017-08-18 NOTE — Assessment & Plan Note (Addendum)
-   Td 2016: Due for a flu shot, Prevnar, Shingrex. Declined all, benefits discussed  -CCS: Cscope 2009, no polyps, next cscope due 2019 - Female care per gyn: PAP and  MMG 2016 ; saw Dr. Linda Hedges  07-21-17, had a MMG, PAP -had a DEXA remotely, ordering one today -Labs: CMP, FLP, TSH, hep C, HIV, CBC

## 2017-08-18 NOTE — Assessment & Plan Note (Signed)
PLAN: Hypothyroidism, goiter: On Synthroid, last [place] w/ no worrisome features.  Check a TSH, consider recheck a [place] next year. History of migraines:  Had migraines with visual aura many years ago, the visual aura has resurface for a year, was having 2-3 episodes a week but now is only once a week.  Typically as soon as she has the aura she takes Tylenol and has been able to prevent progression to HAs. For now we will continue with abortive treatment, if sxs severe or different she is to let me know. Throat congestion: Going on for years, saw ENT in [place] approximately 5 years ago, was told she was okay.  Will do a trial with Flonase, omeprazole.  See instructions. RTC 1 year although she may need a TSH check before next visit

## 2017-08-18 NOTE — Progress Notes (Signed)
Subjective:    Patient ID: Shannon Mendez, female    DOB: 1950-10-30, 67 y.o.   MRN: [id number]  DOS:  08/18/2017 Type of visit - description : cpx Interval history: In general feeling well.  She does have a few concerns, see below.   Review of Systems Several year history of mucus accumulation in the throat in the mornings, she has to clear her throat frequently mostly in the mornings but some in the afternoon. Occasionally she has coughing spells when she clears her throat. No heartburn per se, no postnasal dripping or itchy eyes.  Was seen by ENT before for these sxs.  Also, migraines have resurface.  Denies dizziness, diplopia, slurred speech.  Other than above, a 14 point review of systems is negative     Past Medical History:  Diagnosis Date  . Fibrous papule of nose 2016   Benign  . Goiter 08/15/2014  . History of chicken pox   . Hypothyroid   . Migraine   . Rheumatic fever    as a child  . Vitreous detachment    2011    Past Surgical History:  Procedure Laterality Date  . TONSILLECTOMY AND ADENOIDECTOMY      Social History   Socioeconomic History  . Marital status: Married    Spouse name: Not on file  . Number of children: 2  . Years of education: Not on file  . Highest education level: Not on file  Social Needs  . Financial resource strain: Not on file  . Food insecurity - worry: Not on file  . Food insecurity - inability: Not on file  . Transportation needs - medical: Not on file  . Transportation needs - non-medical: Not on file  Occupational History  . 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 Smoking status: Never Smoker  . Smokeless tobacco: Never Used  Substance and Sexual Activity  . Alcohol use: Yes    Alcohol/week: 0.0 oz    Comment: Occasional  . Drug use: No  . Sexual activity: Not on file  Other Topics Concern  . Not on file  Social History Narrative   Moved from [place] 2014     Has a son and a    daughter (she lives in [place]) they were born in the 10s         Family History  Problem Relation Age of Onset  . Ovarian cancer Mother        in her 1s  . Diabetes Other        GM ?  Marland Kitchen Colon cancer Neg Hx   . Cervical cancer Neg Hx   . Breast cancer Neg Hx   . CAD Neg Hx      Allergies as of 08/18/2017      Reactions   Amoxicillin Hives      Medication List        Accurate as of 08/18/17  4:57 PM. Always use your most recent med list.          levothyroxine 88 MCG tablet Commonly known as:  SYNTHROID, LEVOTHROID Take 1 tablet (88 mcg total) by mouth daily before breakfast.          Objective:   Physical Exam BP 124/68 (BP Location: Right Arm, Patient Position: Sitting, Cuff Size: Small)   Pulse (!) 58   Temp 98.4 F (36.9 C) (Oral)   Resp 14   Ht  5\' 4"  (1.626 m)   Wt 151 lb 2 oz (68.5 kg)   SpO2 98%   BMI 25.94 kg/m  General:   Well developed, well nourished . NAD.  Neck: No  thyromegaly  HEENT:  Normocephalic . Face symmetric, atraumatic.  Nose not congested, sinuses non-TTP Lungs:  CTA B Normal respiratory effort, no intercostal retractions, no accessory muscle use. Heart: RRR,  no murmur.  No pretibial edema bilaterally  Abdomen:  Not distended, soft, non-tender. No rebound or rigidity.   Skin: Exposed areas without rash. Not pale. Not jaundice Neurologic:  alert & oriented X3.  Speech normal, gait appropriate for age and unassisted Strength symmetric and appropriate for age.  Psych: Cognition and judgment appear intact.  Cooperative with normal attention span and concentration.  Behavior appropriate. No anxious or depressed appearing.     Assessment & Plan:    Assessment Hypothyroidism Goiter: s/p USs in [place], last ~ 2013, s/p bx ~ 2011 (-); [place] 2016: slt enlarge w/o worrisome features h/o migraines Vitreous detachment 2011 Rheumatic fever as a child  PLAN: Hypothyroidism, goiter: On Synthroid, last [place] w/ no  worrisome features.  Check a TSH, consider recheck a [place] next year. History of migraines:  Had migraines with visual aura many years ago, the visual aura has resurface for a year, was having 2-3 episodes a week but now is only once a week.  Typically as soon as she has the aura she takes Tylenol and has been able to prevent progression to HAs. For now we will continue with abortive treatment, if sxs severe or different she is to let me know. Throat congestion: Going on for years, saw ENT in [place] approximately 5 years ago, was told she was okay.  Will do a trial with Flonase, omeprazole.  See instructions. RTC 1 year although she may need a TSH check before next visit

## 2017-08-18 NOTE — Progress Notes (Signed)
Pre visit review using our clinic review tool, if applicable. No additional management support is needed unless otherwise documented below in the visit note. 

## 2017-08-19 LAB — HIV ANTIBODY (ROUTINE TESTING W REFLEX): HIV 1&2 Ab, 4th Generation: NONREACTIVE

## 2017-08-19 LAB — HEPATITIS C ANTIBODY
Hepatitis C Ab: NONREACTIVE
SIGNAL TO CUT-OFF: 0.03 (ref <1.00)

## 2017-08-22 MED ORDER — LEVOTHYROXINE SODIUM 100 MCG PO TABS: 100.0000 ug | ORAL_TABLET | Freq: Every day | ORAL | 0 refills | Status: DC

## 2017-08-22 NOTE — Addendum Note (Signed)
Addended byDamita Dunnings D on: 08/22/2017 08:30 AM   Modules accepted: Orders

## 2017-10-02 ENCOUNTER — Ambulatory Visit (INDEPENDENT_AMBULATORY_CARE_PROVIDER_SITE_OTHER): Payer: Medicare HMO | Admitting: Internal Medicine

## 2017-10-02 ENCOUNTER — Encounter: Payer: Self-pay | Admitting: Internal Medicine

## 2017-10-02 ENCOUNTER — Ambulatory Visit (HOSPITAL_BASED_OUTPATIENT_CLINIC_OR_DEPARTMENT_OTHER)
Admission: RE | Admit: 2017-10-02 | Discharge: 2017-10-02 | Disposition: A | Discharge status: Home / Self Care | Payer: Medicare HMO | Source: Ambulatory Visit | Attending: Internal Medicine | Admitting: Internal Medicine

## 2017-10-02 VITALS — BP 116/66 | HR 54 | Temp 97.6°F | Resp 14 | Ht 64.0 in | Wt 152.1 lb

## 2017-10-02 DIAGNOSIS — M25571 Pain in right ankle and joints of right foot: Secondary | ICD-10-CM | POA: Insufficient documentation

## 2017-10-02 DIAGNOSIS — M778 Other enthesopathies, not elsewhere classified: Secondary | ICD-10-CM | POA: Diagnosis not present

## 2017-10-02 DIAGNOSIS — M85852 Other specified disorders of bone density and structure, left thigh: Secondary | ICD-10-CM | POA: Diagnosis not present

## 2017-10-02 DIAGNOSIS — Z780 Asymptomatic menopausal state: Secondary | ICD-10-CM | POA: Diagnosis not present

## 2017-10-02 DIAGNOSIS — M2141 Flat foot [pes planus] (acquired), right foot: Secondary | ICD-10-CM | POA: Diagnosis not present

## 2017-10-02 DIAGNOSIS — M8589 Other specified disorders of bone density and structure, multiple sites: Secondary | ICD-10-CM | POA: Diagnosis not present

## 2017-10-02 DIAGNOSIS — E039 Hypothyroidism, unspecified: Secondary | ICD-10-CM | POA: Diagnosis not present

## 2017-10-02 LAB — TSH: TSH: 1.08 u[IU]/mL (ref 0.35–4.50)

## 2017-10-02 NOTE — Assessment & Plan Note (Signed)
Ankle pain: Unclear etiology, no obvious inflammation, injury.  She does have flat feet. Plan: X-ray, continue with the Ace wrap, occasional Tylenol and ibuprofen.  Call if not improving.  Ice/cold might help >> ok to try. Hypothyroidism: Recheck a TSH.

## 2017-10-02 NOTE — Progress Notes (Signed)
Subjective:    Patient ID: Shannon Mendez, female    DOB: 03/11/1951, 67 y.o.   MRN: [id number]  DOS:  10/02/2017 Type of visit - description : Acute visit, here with her husband Interval history: Developed right ankle pain 10 days ago; pain  only when she walks, denies any injury.  No previous episode like this one, pain is located at the medial aspect of the ankle and radiates upwards with walking. Ace wrap helped to some extent. Due for a TSH   Review of Systems Denies fever chills Ankle has not been red or swollen.  Past Medical History:  Diagnosis Date  . Fibrous papule of nose 2016   Benign  . Goiter 08/15/2014  . History of chicken pox   . Hypothyroid   . Migraine   . Rheumatic fever    as a child  . Vitreous detachment    2011    Past Surgical History:  Procedure Laterality Date  . TONSILLECTOMY AND ADENOIDECTOMY      Social History   Socioeconomic History  . Marital status: Married    Spouse name: Not on file  . Number of children: 2  . Years of education: Not on file  . Highest education level: Not on file  Social Needs  . Financial resource strain: Not on file  . Food insecurity - worry: Not on file  . Food insecurity - inability: Not on file  . Transportation needs - medical: Not on file  . Transportation needs - non-medical: Not on file  Occupational History  . 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 Smoking status: Never Smoker  . Smokeless tobacco: Never Used  Substance and Sexual Activity  . Alcohol use: Yes    Alcohol/week: 0.0 oz    Comment: Occasional  . Drug use: No  . Sexual activity: Not on file  Other Topics Concern  . Not on file  Social History Narrative   Moved from [place] 2014   Has a son and a    daughter (she lives in [place]) they were born in the 5s          Allergies as of 10/02/2017      Reactions   Amoxicillin Hives      Medication List        Accurate as  of 10/02/17  9:42 PM. Always use your most recent med list.          levothyroxine 100 MCG tablet Commonly known as:  SYNTHROID, LEVOTHROID Take 1 tablet (100 mcg total) by mouth daily before breakfast.          Objective:   Physical Exam  Musculoskeletal:       Feet:   BP 116/66 (BP Location: Right Arm, Patient Position: Sitting, Cuff Size: Small)   Pulse (!) 54   Temp 97.6 F (36.4 C) (Oral)   Resp 14   Ht 5\' 4"  (1.626 m)   Wt 152 lb 2 oz (69 kg)   SpO2 97%   BMI 26.11 kg/m  General:   Well developed, well nourished . NAD.  HEENT:  Normocephalic . Face symmetric, atraumatic Ankles, feet: Symmetric, warm, good capillary refills, no swelling, redness, no TTP.  ROM normal.  + Flat food Skin: Not pale. Not jaundice Neurologic:  alert & oriented X3.  Speech normal, gait appropriate for age and unassisted Psych--  Cognition and judgment appear intact.  Cooperative with  normal attention span and concentration.  Behavior appropriate. No anxious or depressed appearing.      Assessment & Plan:    Assessment Hypothyroidism Goiter: s/p USs in [place], last ~ 2013, s/p bx ~ 2011 (-); [place] 2016: slt enlarge w/o worrisome features h/o migraines Vitreous detachment 2011 Rheumatic fever as a child  PLAN: Ankle pain: Unclear etiology, no obvious inflammation, injury.  She does have flat feet. Plan: X-ray, continue with the Ace wrap, occasional Tylenol and ibuprofen.  Call if not improving.  Ice/cold might help >> ok to try. Hypothyroidism: Recheck a TSH.

## 2017-10-02 NOTE — Patient Instructions (Signed)
GO TO THE LAB : Get the blood work    STOP BY THE FIRST FLOOR:  get the XR   Ace wrap Tylenol  500 mg OTC 2 tabs a day every 8 hours as needed for pain  IBUPROFEN (Advil or Motrin) 200 mg 2 tablets every 12 hours as needed for pain.  Always take it with food because may cause gastritis and ulcers.  If you notice nausea, stomach pain, change in the color of stools --->  Stop the medicine and let us know  Call if no better

## 2017-10-02 NOTE — Progress Notes (Signed)
Pre visit review using our clinic review tool, if applicable. No additional management support is needed unless otherwise documented below in the visit note. 

## 2017-10-06 ENCOUNTER — Telehealth: Payer: Self-pay | Admitting: Internal Medicine

## 2017-10-06 NOTE — Telephone Encounter (Signed)
Copied from [place] [id number]. Topic: Quick Communication - See Telephone Encounter >> Oct 06, 2017 12:35 PM Ether Griffins B wrote: CRM for notification. See Telephone encounter for:  Pt calling wanting the results of her xrays  10/06/17.

## 2017-10-06 NOTE — Telephone Encounter (Signed)
Spoke w/ Pt, informed her of results- she is still having pain- offered sports medicine referral- she declined for now. She will wait several more days- if not improving she will call for referral.

## 2017-12-05 ENCOUNTER — Other Ambulatory Visit: Payer: Self-pay | Admitting: Internal Medicine

## 2017-12-06 ENCOUNTER — Encounter: Payer: Self-pay | Admitting: Gastroenterology

## 2018-02-27 DIAGNOSIS — Z880 Allergy status to penicillin: Secondary | ICD-10-CM | POA: Diagnosis not present

## 2018-02-27 DIAGNOSIS — Z833 Family history of diabetes mellitus: Secondary | ICD-10-CM | POA: Diagnosis not present

## 2018-02-27 DIAGNOSIS — Z809 Family history of malignant neoplasm, unspecified: Secondary | ICD-10-CM | POA: Diagnosis not present

## 2018-02-27 DIAGNOSIS — Z8249 Family history of ischemic heart disease and other diseases of the circulatory system: Secondary | ICD-10-CM | POA: Diagnosis not present

## 2018-02-27 DIAGNOSIS — E039 Hypothyroidism, unspecified: Secondary | ICD-10-CM | POA: Diagnosis not present

## 2018-02-27 DIAGNOSIS — Z803 Family history of malignant neoplasm of breast: Secondary | ICD-10-CM | POA: Diagnosis not present

## 2018-07-09 DIAGNOSIS — R69 Illness, unspecified: Secondary | ICD-10-CM | POA: Diagnosis not present

## 2018-07-12 DIAGNOSIS — R69 Illness, unspecified: Secondary | ICD-10-CM | POA: Diagnosis not present

## 2018-07-23 DIAGNOSIS — Z6826 Body mass index (BMI) 26.0-26.9, adult: Secondary | ICD-10-CM | POA: Diagnosis not present

## 2018-07-23 DIAGNOSIS — Z01419 Encounter for gynecological examination (general) (routine) without abnormal findings: Secondary | ICD-10-CM | POA: Diagnosis not present

## 2018-07-23 DIAGNOSIS — Z1231 Encounter for screening mammogram for malignant neoplasm of breast: Secondary | ICD-10-CM | POA: Diagnosis not present

## 2018-07-23 LAB — HM MAMMOGRAPHY

## 2018-08-22 ENCOUNTER — Encounter: Payer: Medicare HMO | Admitting: Internal Medicine

## 2018-09-07 ENCOUNTER — Encounter: Payer: Self-pay | Admitting: Internal Medicine

## 2018-09-07 ENCOUNTER — Ambulatory Visit (INDEPENDENT_AMBULATORY_CARE_PROVIDER_SITE_OTHER): Payer: Medicare HMO | Admitting: Internal Medicine

## 2018-09-07 VITALS — BP 114/64 | HR 61 | Temp 97.8°F | Resp 16 | Ht 64.0 in | Wt 148.1 lb

## 2018-09-07 DIAGNOSIS — E039 Hypothyroidism, unspecified: Secondary | ICD-10-CM

## 2018-09-07 DIAGNOSIS — Z0000 Encounter for general adult medical examination without abnormal findings: Secondary | ICD-10-CM | POA: Diagnosis not present

## 2018-09-07 DIAGNOSIS — Z1211 Encounter for screening for malignant neoplasm of colon: Secondary | ICD-10-CM

## 2018-09-07 DIAGNOSIS — E559 Vitamin D deficiency, unspecified: Secondary | ICD-10-CM | POA: Diagnosis not present

## 2018-09-07 LAB — COMPREHENSIVE METABOLIC PANEL
ALT: 14 U/L (ref 0–35)
AST: 15 U/L (ref 0–37)
Albumin: 4.4 g/dL (ref 3.5–5.2)
Alkaline Phosphatase: 96 U/L (ref 39–117)
BUN: 15 mg/dL (ref 6–23)
CHLORIDE: 105 meq/L (ref 96–112)
CO2: 29 mEq/L (ref 19–32)
Calcium: 9.4 mg/dL (ref 8.4–10.5)
Creatinine, Ser: 0.84 mg/dL (ref 0.40–1.20)
GFR: 67.79 mL/min (ref >60.00)
GLUCOSE: 81 mg/dL (ref 70–99)
POTASSIUM: 5 meq/L (ref 3.5–5.1)
SODIUM: 141 meq/L (ref 135–145)
Total Bilirubin: 0.7 mg/dL (ref 0.2–1.2)
Total Protein: 6.8 g/dL (ref 6.0–8.3)

## 2018-09-07 LAB — CBC WITH DIFFERENTIAL/PLATELET
Basophils Absolute: 0.1 10*3/uL (ref 0.0–0.1)
Basophils Relative: 1 % (ref 0.0–3.0)
EOS ABS: 0.1 10*3/uL (ref 0.0–0.7)
EOS PCT: 1 % (ref 0.0–5.0)
HCT: 42.7 % (ref 36.0–46.0)
HEMOGLOBIN: 14.5 g/dL (ref 12.0–15.0)
LYMPHS ABS: 1.8 10*3/uL (ref 0.7–4.0)
Lymphocytes Relative: 26.2 % (ref 12.0–46.0)
MCHC: 33.9 g/dL (ref 30.0–36.0)
MCV: 90.9 fl (ref 78.0–100.0)
MONO ABS: 0.5 10*3/uL (ref 0.1–1.0)
Monocytes Relative: 7.5 % (ref 3.0–12.0)
NEUTROS PCT: 64.3 % (ref 43.0–77.0)
Neutro Abs: 4.3 10*3/uL (ref 1.4–7.7)
Platelets: 253 10*3/uL (ref 150.0–400.0)
RBC: 4.7 Mil/uL (ref 3.87–5.11)
RDW: 12.9 % (ref 11.5–15.5)
WBC: 6.7 10*3/uL (ref 4.0–10.5)

## 2018-09-07 LAB — TSH: TSH: 0.31 u[IU]/mL — AB (ref 0.35–4.50)

## 2018-09-07 LAB — LIPID PANEL
Cholesterol: 219 mg/dL — ABNORMAL HIGH (ref 0–200)
HDL: 45.5 mg/dL (ref >39.00)
LDL CALC: 139 mg/dL — AB (ref 0–99)
NonHDL: 173.15
Total CHOL/HDL Ratio: 5
Triglycerides: 171 mg/dL — ABNORMAL HIGH (ref 0.0–149.0)
VLDL: 34.2 mg/dL (ref 0.0–40.0)

## 2018-09-07 NOTE — Patient Instructions (Addendum)
Please schedule Medicare Wellness with Glenard Haring.    GO TO THE LAB : Get the blood work     GO TO THE FRONT DESK Schedule your next appointment   for a physical exam in 1 year  Take calcium vitamin D over-the-counter daily. Calcium: 1 g Vitamin D approximately 800 units

## 2018-09-07 NOTE — Assessment & Plan Note (Addendum)
-   Td 2016. Declined all immunizations, benefits discussed  -CCS: Cscope 2009, no polyps, referred to GI - Female care per gyn: Last visit with Dr. Linda Hedges July 24, 2019.  We will try to get records -T score -1.9 (09-2017) not on supplements, recommend to take calcium and vitamin D. - She is active and eating healthy -Labs: CMP, FLP, CBC, TSH, vitamin D

## 2018-09-07 NOTE — Progress Notes (Signed)
Subjective:    Patient ID: Shannon Mendez, female    DOB: 1951/07/15, 68 y.o.   MRN: [id number]  DOS:  09/07/2018 Type of visit - description: CPX No major concerns, doing well.  Review of Systems  A 14 point review of systems is negative    Past Medical History:  Diagnosis Date  . Fibrous papule of nose 2016   Benign  . Goiter 08/15/2014  . History of chicken pox   . Hypothyroid   . Migraine   . Rheumatic fever    as a child  . Vitreous detachment    2011    Past Surgical History:  Procedure Laterality Date  . TONSILLECTOMY AND ADENOIDECTOMY      Social History   Socioeconomic History  . Marital status: Married    Spouse name: Not on file  . Number of children: 2  . Years of education: Not on file  . Highest education level: Not on file  Occupational History  . 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 Financial resource strain: Not on file  . Food insecurity:    Worry: Not on file    Inability: Not on file  . Transportation needs:    Medical: Not on file    Non-medical: Not on file  Tobacco Use  . Smoking status: Never Smoker  . Smokeless tobacco: Never Used  Substance and Sexual Activity  . Alcohol use: Yes    Alcohol/week: 0.0 standard drinks    Comment: Occasional  . Drug use: No  . Sexual activity: Not on file  Lifestyle  . Physical activity:    Days per week: Not on file    Minutes per session: Not on file  . Stress: Not on file  Relationships  . Social connections:    Talks on phone: Not on file    Gets together: Not on file    Attends religious service: Not on file    Active member of club or organization: Not on file    Attends meetings of clubs or organizations: Not on file    Relationship status: Not on file  . Intimate partner violence:    Fear of current or ex partner: Not on file    Emotionally abused: Not on file    Physically abused: Not on file    Forced sexual activity:  Not on file  Other Topics Concern  . Not on file  Social History Narrative   Moved from [place] 2014   Has a son and a  daughter (she lives in [place]) they were born in the 9s         Family History  Problem Relation Age of Onset  . Ovarian cancer Mother        in her 65s  . Diabetes Other        GM ?  . Lung cancer Father   . Aneurysm Father        Aorta A  . Colon cancer Neg Hx   . Cervical cancer Neg Hx   . Breast cancer Neg Hx   . CAD Neg Hx      Allergies as of 09/07/2018      Reactions   Amoxicillin Hives      Medication List       Accurate as of September 07, 2018 11:59 PM. Always use your most recent med list.  levothyroxine 100 MCG tablet Commonly known as:  SYNTHROID, LEVOTHROID Take 1 tablet (100 mcg total) by mouth daily before breakfast.           Objective:   Physical Exam Neck:     BP 114/64 (BP Location: Right Arm, Patient Position: Sitting, Cuff Size: Small)   Pulse 61   Temp 97.8 F (36.6 C) (Oral)   Resp 16   Ht 5\' 4"  (1.626 m)   Wt 148 lb 2 oz (67.2 kg)   SpO2 97%   BMI 25.43 kg/m  General: Well developed, NAD, BMI noted Neck: No  thyromegaly  HEENT:  Normocephalic . Face symmetric, atraumatic Lungs:  CTA B Normal respiratory effort, no intercostal retractions, no accessory muscle use. Heart: RRR,  no murmur.  No pretibial edema bilaterally  Abdomen:  Not distended, soft, non-tender. No rebound or rigidity.   Skin: Exposed areas without rash. Not pale. Not jaundice Neurologic:  alert & oriented X3.  Speech normal, gait appropriate for age and unassisted Strength symmetric and appropriate for age.  Psych: Cognition and judgment appear intact.  Cooperative with normal attention span and concentration.  Behavior appropriate. No anxious or depressed appearing.     Assessment      Assessment Hypothyroidism Goiter: s/p USs in [place], last ~ 2013, s/p bx ~ 2011 (-); [place] 2016: slt enlarge w/o worrisome features h/o  migraines Vitreous detachment 2011 Rheumatic fever as a child  PLAN: Hypothyroidism: Good compliance with meds, check a TSH Goiter: Last ultrasound 2016.  Exam is essentially negative Migraines: Much better in the last 2 to 3 months.  In fact last headache was 2 months ago Vitreous detachment, h/o: Recommend to see the eye doctor yearly Lipoma: See physical exam, lump has been there for 10 years and is not changing.  Recommend self-monitoring. RTC 1 year

## 2018-09-07 NOTE — Progress Notes (Signed)
Pre visit review using our clinic review tool, if applicable. No additional management support is needed unless otherwise documented below in the visit note. 

## 2018-09-08 NOTE — Assessment & Plan Note (Signed)
Hypothyroidism: Good compliance with meds, check a TSH Goiter: Last ultrasound 2016.  Exam is essentially negative Migraines: Much better in the last 2 to 3 months.  In fact last headache was 2 months ago Vitreous detachment, h/o: Recommend to see the eye doctor yearly Lipoma: See physical exam, lump has been there for 10 years and is not changing.  Recommend self-monitoring. RTC 1 year

## 2018-09-10 ENCOUNTER — Encounter: Payer: Self-pay | Admitting: Internal Medicine

## 2018-09-12 LAB — VITAMIN D 1,25 DIHYDROXY
VITAMIN D3 1, 25 (OH): 38 pg/mL
Vitamin D 1, 25 (OH)2 Total: 38 pg/mL (ref 18–72)

## 2018-09-12 MED ORDER — LEVOTHYROXINE SODIUM 88 MCG PO TABS: 88.0000 ug | ORAL_TABLET | Freq: Every day | ORAL | 2 refills | Status: DC

## 2018-09-12 NOTE — Addendum Note (Signed)
Addended byDamita Dunnings D on: 09/12/2018 04:57 PM   Modules accepted: Orders

## 2018-10-03 DIAGNOSIS — Z7289 Other problems related to lifestyle: Secondary | ICD-10-CM | POA: Diagnosis not present

## 2018-10-03 DIAGNOSIS — H6123 Impacted cerumen, bilateral: Secondary | ICD-10-CM | POA: Diagnosis not present

## 2018-10-03 DIAGNOSIS — H93291 Other abnormal auditory perceptions, right ear: Secondary | ICD-10-CM | POA: Diagnosis not present

## 2018-10-24 ENCOUNTER — Other Ambulatory Visit (INDEPENDENT_AMBULATORY_CARE_PROVIDER_SITE_OTHER): Payer: Medicare HMO

## 2018-10-24 ENCOUNTER — Other Ambulatory Visit: Payer: Self-pay

## 2018-10-24 DIAGNOSIS — E039 Hypothyroidism, unspecified: Secondary | ICD-10-CM | POA: Diagnosis not present

## 2018-10-24 LAB — TSH: TSH: 0.32 u[IU]/mL — ABNORMAL LOW (ref 0.35–4.50)

## 2018-10-26 MED ORDER — LEVOTHYROXINE SODIUM 75 MCG PO TABS: 75.0000 ug | ORAL_TABLET | Freq: Every day | ORAL | 0 refills | Status: DC

## 2018-10-26 NOTE — Addendum Note (Signed)
Addended byDamita Dunnings D on: 10/26/2018 08:42 AM   Modules accepted: Orders

## 2018-12-04 ENCOUNTER — Other Ambulatory Visit (INDEPENDENT_AMBULATORY_CARE_PROVIDER_SITE_OTHER): Payer: Medicare HMO

## 2018-12-04 ENCOUNTER — Other Ambulatory Visit: Payer: Self-pay

## 2018-12-04 DIAGNOSIS — E039 Hypothyroidism, unspecified: Secondary | ICD-10-CM

## 2018-12-04 LAB — TSH: TSH: 0.05 u[IU]/mL — ABNORMAL LOW (ref 0.35–4.50)

## 2018-12-07 MED ORDER — LEVOTHYROXINE SODIUM 50 MCG PO TABS: 50.0000 ug | ORAL_TABLET | Freq: Every day | ORAL | 2 refills | Status: DC

## 2018-12-07 NOTE — Addendum Note (Signed)
Addended byDamita Dunnings D on: 12/07/2018 12:44 PM   Modules accepted: Orders

## 2018-12-12 ENCOUNTER — Encounter: Payer: Self-pay | Admitting: Internal Medicine

## 2018-12-12 ENCOUNTER — Telehealth: Payer: Self-pay | Admitting: Internal Medicine

## 2018-12-12 NOTE — Telephone Encounter (Signed)
Left msg for pt to call back

## 2018-12-12 NOTE — Telephone Encounter (Signed)
-----   Message from Colon Branch, MD sent at 12/12/2018  9:51 AM EDT ----- Regarding: Please for sure she has a lab appointment for July, we need to check her TSH

## 2019-02-19 ENCOUNTER — Other Ambulatory Visit (INDEPENDENT_AMBULATORY_CARE_PROVIDER_SITE_OTHER): Payer: Medicare HMO

## 2019-02-19 ENCOUNTER — Other Ambulatory Visit: Payer: Self-pay

## 2019-02-19 DIAGNOSIS — E039 Hypothyroidism, unspecified: Secondary | ICD-10-CM | POA: Diagnosis not present

## 2019-02-19 LAB — TSH: TSH: 2.13 u[IU]/mL (ref 0.35–4.50)

## 2019-02-19 MED ORDER — LEVOTHYROXINE SODIUM 50 MCG PO TABS: 50.0000 ug | ORAL_TABLET | Freq: Every day | ORAL | 1 refills | Status: DC

## 2019-02-19 NOTE — Addendum Note (Signed)
Addended byDamita Dunnings D on: 02/19/2019 04:38 PM   Modules accepted: Orders

## 2019-03-11 ENCOUNTER — Other Ambulatory Visit: Payer: Self-pay

## 2019-03-13 ENCOUNTER — Ambulatory Visit (INDEPENDENT_AMBULATORY_CARE_PROVIDER_SITE_OTHER): Payer: Medicare HMO | Admitting: Internal Medicine

## 2019-03-13 ENCOUNTER — Encounter: Payer: Self-pay | Admitting: Internal Medicine

## 2019-03-13 ENCOUNTER — Other Ambulatory Visit: Payer: Self-pay

## 2019-03-13 VITALS — BP 127/54 | HR 58 | Temp 98.3°F | Resp 16 | Ht 64.0 in | Wt 144.5 lb

## 2019-03-13 DIAGNOSIS — N644 Mastodynia: Secondary | ICD-10-CM

## 2019-03-13 NOTE — Progress Notes (Signed)
Pre visit review using our clinic review tool, if applicable. No additional management support is needed unless otherwise documented below in the visit note. 

## 2019-03-13 NOTE — Patient Instructions (Addendum)
Continue checking your breast from time to time  Get over-the-counter vitamin D 400 units daily for 1 month  If you notice any lump, persistent pain, redness, nipple discharge please let me know

## 2019-03-13 NOTE — Progress Notes (Signed)
Subjective:    Patient ID: Shannon Mendez, female    DOB: 06-27-1951, 68 y.o.   MRN: [id number]  DOS:  03/13/2019 Type of visit - description:  Acute 1 week history of episodic discomfort at the  right breast, inner lower quadrant, the pressure lasts "a few seconds". Self palpation: no lump   Review of Systems Denies fever chills.  No cough No nipple discharge, breast redness or breast injury  Past Medical History:  Diagnosis Date  . Fibrous papule of nose 2016   Benign  . Goiter 08/15/2014  . History of chicken pox   . Hypothyroid   . Migraine   . Rheumatic fever    as a child  . Vitreous detachment    2011    Past Surgical History:  Procedure Laterality Date  . TONSILLECTOMY AND ADENOIDECTOMY      Social History   Socioeconomic History  . Marital status: Married    Spouse name: Not on file  . Number of children: 2  . Years of education: Not on file  . Highest education level: Not on file  Occupational History  . 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 Financial resource strain: Not on file  . Food insecurity    Worry: Not on file    Inability: Not on file  . Transportation needs    Medical: Not on file    Non-medical: Not on file  Tobacco Use  . Smoking status: Never Smoker  . Smokeless tobacco: Never Used  Substance and Sexual Activity  . Alcohol use: Yes    Alcohol/week: 0.0 standard drinks    Comment: Occasional  . Drug use: No  . Sexual activity: Not on file  Lifestyle  . Physical activity    Days per week: Not on file    Minutes per session: Not on file  . Stress: Not on file  Relationships  . Social Herbalist on phone: Not on file    Gets together: Not on file    Attends religious service: Not on file    Active member of club or organization: Not on file    Attends meetings of clubs or organizations: Not on file    Relationship status: Not on file  . Intimate partner  violence    Fear of current or ex partner: Not on file    Emotionally abused: Not on file    Physically abused: Not on file    Forced sexual activity: Not on file  Other Topics Concern  . Not on file  Social History Narrative   Moved from [place] 2014   Has a son and a  daughter (she lives in [place]) they were born in the 21s          Allergies as of 03/13/2019      Reactions   Amoxicillin Hives      Medication List       Accurate as of March 13, 2019 10:41 AM. If you have any questions, ask your nurse or doctor.        levothyroxine 50 MCG tablet Commonly known as: SYNTHROID Take 1 tablet (50 mcg total) by mouth daily before breakfast.           Objective:   Physical Exam BP (!) 127/54 (BP Location: Left Arm, Patient Position: Sitting, Cuff Size: Small)   Pulse (!) 58   Temp 98.3 F (36.8  C) (Oral)   Resp 16   Ht 5\' 4"  (1.626 m)   Wt 144 lb 8 oz (65.5 kg)   SpO2 100%   BMI 24.80 kg/m     General:   Well developed, NAD, BMI noted. HEENT:  Normocephalic . Face symmetric, atraumatic Neck: No mass or lymphadenopathy at the neck or supraclavicular areas Breast and axillary areas: Normal.  Skin normal, nipple normal, no dominant mass, no tenderness, no redness or warmness. Skin: Not pale. Not jaundice Neurologic:  alert & oriented X3.  Speech normal, gait appropriate for age and unassisted Psych--  Cognition and judgment appear intact.  Cooperative with normal attention span and concentration.  Behavior appropriate. No anxious or depressed appearing.   Assessment      Assessment Hypothyroidism Goiter: s/p USs in [place], last ~ 2013, s/p bx ~ 2011 (-); [place] 2016: slt enlarge w/o worrisome features h/o migraines Vitreous detachment 2011 Rheumatic fever as a child  PLAN: R Mastalgia: Self breast exam and clinical breast exam negative.  Update on screening for breast cancer. Recommend OTC vitamin E  X 4 weeks  Call if problem persist, continue with breast  self-awareness. If sxs persitent pt request further eval so I would rx a Dx MMG [place] if needed

## 2019-03-14 NOTE — Assessment & Plan Note (Signed)
R Mastalgia: Self breast exam and clinical breast exam negative.  Update on screening for breast cancer. Recommend OTC vitamin E  X 4 weeks  Call if problem persist, continue with breast self-awareness. If sxs persitent pt request further eval so I would rx a Dx MMG [place] if needed

## 2019-05-21 DIAGNOSIS — H6121 Impacted cerumen, right ear: Secondary | ICD-10-CM | POA: Diagnosis not present

## 2019-05-21 DIAGNOSIS — H9191 Unspecified hearing loss, right ear: Secondary | ICD-10-CM | POA: Diagnosis not present

## 2019-05-21 DIAGNOSIS — Z7289 Other problems related to lifestyle: Secondary | ICD-10-CM | POA: Diagnosis not present

## 2019-05-21 DIAGNOSIS — H938X1 Other specified disorders of right ear: Secondary | ICD-10-CM | POA: Diagnosis not present

## 2019-05-23 ENCOUNTER — Ambulatory Visit (INDEPENDENT_AMBULATORY_CARE_PROVIDER_SITE_OTHER): Payer: Medicare HMO

## 2019-05-23 ENCOUNTER — Other Ambulatory Visit: Payer: Self-pay

## 2019-05-23 DIAGNOSIS — Z23 Encounter for immunization: Secondary | ICD-10-CM | POA: Diagnosis not present

## 2019-05-23 NOTE — Progress Notes (Signed)
Patient was scheduled for pneumovax 23 06-25-2019

## 2019-06-25 ENCOUNTER — Other Ambulatory Visit: Payer: Self-pay

## 2019-06-25 ENCOUNTER — Ambulatory Visit (INDEPENDENT_AMBULATORY_CARE_PROVIDER_SITE_OTHER): Payer: Medicare HMO | Admitting: *Deleted

## 2019-06-25 DIAGNOSIS — Z23 Encounter for immunization: Secondary | ICD-10-CM

## 2019-06-25 NOTE — Progress Notes (Signed)
Patient here for pneumovax vaccine.  Vaccine given in left deltoid and patient tolerated well.

## 2019-09-04 DIAGNOSIS — Z1231 Encounter for screening mammogram for malignant neoplasm of breast: Secondary | ICD-10-CM | POA: Diagnosis not present

## 2019-09-04 DIAGNOSIS — Z124 Encounter for screening for malignant neoplasm of cervix: Secondary | ICD-10-CM | POA: Diagnosis not present

## 2019-09-04 DIAGNOSIS — Z6826 Body mass index (BMI) 26.0-26.9, adult: Secondary | ICD-10-CM | POA: Diagnosis not present

## 2019-09-04 LAB — HM MAMMOGRAPHY

## 2019-09-04 LAB — HM PAP SMEAR

## 2019-09-10 ENCOUNTER — Other Ambulatory Visit: Payer: Self-pay | Admitting: Internal Medicine

## 2019-09-18 ENCOUNTER — Ambulatory Visit (INDEPENDENT_AMBULATORY_CARE_PROVIDER_SITE_OTHER): Payer: Medicare HMO | Admitting: Internal Medicine

## 2019-09-18 ENCOUNTER — Other Ambulatory Visit: Payer: Self-pay

## 2019-09-18 ENCOUNTER — Encounter: Payer: Self-pay | Admitting: Internal Medicine

## 2019-09-18 VITALS — BP 120/59 | HR 56 | Temp 96.1°F | Resp 16 | Ht 64.0 in | Wt 146.2 lb

## 2019-09-18 DIAGNOSIS — E039 Hypothyroidism, unspecified: Secondary | ICD-10-CM

## 2019-09-18 DIAGNOSIS — E049 Nontoxic goiter, unspecified: Secondary | ICD-10-CM

## 2019-09-18 DIAGNOSIS — Z0000 Encounter for general adult medical examination without abnormal findings: Secondary | ICD-10-CM | POA: Diagnosis not present

## 2019-09-18 LAB — CBC WITH DIFFERENTIAL/PLATELET
Basophils Absolute: 0.1 K/uL (ref 0.0–0.1)
Basophils Relative: 1.1 % (ref 0.0–3.0)
Eosinophils Absolute: 0.1 K/uL (ref 0.0–0.7)
Eosinophils Relative: 1.3 % (ref 0.0–5.0)
HCT: 42.1 % (ref 36.0–46.0)
Hemoglobin: 14.3 g/dL (ref 12.0–15.0)
Lymphocytes Relative: 25.7 % (ref 12.0–46.0)
Lymphs Abs: 1.9 K/uL (ref 0.7–4.0)
MCHC: 33.9 g/dL (ref 30.0–36.0)
MCV: 91.9 fl (ref 78.0–100.0)
Monocytes Absolute: 0.5 K/uL (ref 0.1–1.0)
Monocytes Relative: 6.8 % (ref 3.0–12.0)
Neutro Abs: 4.7 K/uL (ref 1.4–7.7)
Neutrophils Relative %: 65.1 % (ref 43.0–77.0)
Platelets: 263 K/uL (ref 150.0–400.0)
RBC: 4.58 Mil/uL (ref 3.87–5.11)
RDW: 13 % (ref 11.5–15.5)
WBC: 7.2 K/uL (ref 4.0–10.5)

## 2019-09-18 LAB — LIPID PANEL
Cholesterol: 229 mg/dL — ABNORMAL HIGH (ref 0–200)
HDL: 51.3 mg/dL (ref >39.00)
LDL Cholesterol: 149 mg/dL — ABNORMAL HIGH (ref 0–99)
NonHDL: 178.11
Total CHOL/HDL Ratio: 4
Triglycerides: 144 mg/dL (ref 0.0–149.0)
VLDL: 28.8 mg/dL (ref 0.0–40.0)

## 2019-09-18 LAB — COMPREHENSIVE METABOLIC PANEL
ALT: 13 U/L (ref 0–35)
AST: 14 U/L (ref 0–37)
Albumin: 4.3 g/dL (ref 3.5–5.2)
Alkaline Phosphatase: 90 U/L (ref 39–117)
BUN: 19 mg/dL (ref 6–23)
CO2: 30 mEq/L (ref 19–32)
Calcium: 9.1 mg/dL (ref 8.4–10.5)
Chloride: 105 mEq/L (ref 96–112)
Creatinine, Ser: 0.9 mg/dL (ref 0.40–1.20)
GFR: 62.41 mL/min (ref >60.00)
Glucose, Bld: 83 mg/dL (ref 70–99)
Potassium: 4.6 mEq/L (ref 3.5–5.1)
Sodium: 140 mEq/L (ref 135–145)
Total Bilirubin: 0.7 mg/dL (ref 0.2–1.2)
Total Protein: 7 g/dL (ref 6.0–8.3)

## 2019-09-18 LAB — TSH: TSH: 6.25 u[IU]/mL — ABNORMAL HIGH (ref 0.35–4.50)

## 2019-09-18 NOTE — Patient Instructions (Addendum)
Please schedule Medicare Wellness with Glenard Haring.   GO TO THE LAB : Get the blood work     GO TO [place] back for a check up in 6 months , please make an appointment   Please go to the first floor and schedule your thyroid ultrasound      We are committed to keeping you informed about the COVID-19 vaccine.  As the vaccine continues to become available for each phase, we will ensure that patients who meet the criteria receive the information they need to access vaccination opportunities. Continue to check your MyChart account and [email or web address] for updates. Please review the Phase 1b information below.  [place] On Tuesday, Jan. 19, the [facility]) and [facility] began large-scale COVID-19 vaccinations at the [facility]. The vaccinations are appointment only and for those 80 and older.  Walk-ins will not be accepted.  All appointments are currently filled. Please join our waiting list for the next available appointments. We will contact you when appointments become available. Please do not sign up more than once.  Join Our Waiting List  Our daily vaccination capacity will continue to increase, including expansion of our [employer] site, increasing mobile clinics across our service region and plans to open COVID-19 vaccination clinics in [place] and [place] counties.  Other Vaccination Opportunities in [place] We are also working in partnership with county health agencies in our service counties to ensure continuing vaccination availability in the weeks and months ahead. Learn more about each county's vaccination efforts in the website links below:   [place]'s phase 1b vaccination guidelines, prioritizing those 65 and over as the next eligible group to receive the  COVID-19 vaccine, are detailed at [email or web address].   Vaccine Safety and Effectiveness Clinical trials for the Pfizer COVID-19 vaccine involved 42,000 people and showed that the vaccine is more than 95% effective in preventing COVID-19 with no serious safety concerns. Similar results have been reported for the Moderna COVID-19 vaccine. Side effects reported in the [place] clinical trials include a sore arm at the injection site, fatigue, headache, chills and fever. While side effects from the [place] COVID-19 vaccine are higher than for a typical flu vaccine, they are lower in many ways than side effects from the leading vaccine to prevent shingles. Side effects are signs that a vaccine is working and are related to your immune system being stimulated to produce antibodies against infection. Side effects from vaccination are far less significant than health impacts from COVID-19.  Staying Informed Pharmacists, infectious disease doctors, critical care nurses and other experts at [facility] continue to speak publicly through media interviews and direct communication with our patients and communities about the safety, effectiveness and importance of vaccines to eliminate COVID-19. In addition, reliable information on vaccine safety, effectiveness, side effects and more is available on the following websites:  [place] Department of Health and Human Services COVID-19 Vaccine Information Website.  U.S. Centers for Disease Control and Prevention [id number] Human resources officer.  Staying Safe We agree with the CDC on what we can do to help our communities get back to normal: Getting "back to normal" is going to take all of our tools. If we use all the tools we have, we stand the best chance of getting our families, communities, schools and workplaces "back to normal" sooner:  Get  vaccinated as soon as vaccines become available within the phase of the state's vaccination rollout plan for which  you meet the eligibility criteria.  Wear a mask.  Stay 6 feet from others and avoid crowds.  Wash hands often.  For our most current information, please visit [email or web address].

## 2019-09-18 NOTE — Progress Notes (Signed)
   Subjective:    Patient ID: Shannon Mendez, female    DOB: [birth year], 69 y.o.   MRN: [id number]  DOS:  09/18/2019 Type of visit - description: CPX Since the last office visit she is doing well.  Has no major concerns except that would like to get thyroid ultrasound.  Review of Systems  Other than above, a 14 point review of systems is negative     Past Medical History:  Diagnosis Date  . Fibrous papule of nose 2016   Benign  . Goiter 08/15/2014  . History of chicken pox   . Hypothyroid   . Migraine   . Rheumatic fever    as a child  . Vitreous detachment    2011    Past Surgical History:  Procedure Laterality Date  . TONSILLECTOMY AND ADENOIDECTOMY      Allergies as of 09/18/2019      Reactions   Penicillins Hives   Amoxicillin Hives      Medication List       Accurate as of September 18, 2019  6:00 PM. If you have any questions, ask your nurse or doctor.        levothyroxine 50 MCG tablet Commonly known as: SYNTHROID Take 1 tablet (50 mcg total) by mouth daily before breakfast.      Family History  Problem Relation Age of Onset  . Ovarian cancer Mother        in her 1s  . Diabetes Other        GM ?  . Lung cancer Father   . Aneurysm Father        Aorta A  . Colon cancer Neg Hx   . Cervical cancer Neg Hx   . Breast cancer Neg Hx   . CAD Neg Hx          Objective:   Physical Exam BP (!) 120/59 (BP Location: Left Arm, Patient Position: Sitting, Cuff Size: Small)   Pulse (!) 56   Temp (!) 96.1 F (35.6 C) (Temporal)   Resp 16   Ht 5\' 4"  (1.626 m)   Wt 146 lb 4 oz (66.3 kg)   SpO2 100%   BMI 25.10 kg/m  General: Well developed, NAD, BMI noted Neck: Thyroid gland symmetrically enlarged, not tender or nodular HEENT:  Normocephalic . Face symmetric, atraumatic Lungs:  CTA B Normal respiratory effort, no intercostal retractions, no accessory muscle use. Heart: RRR,  no murmur.  No pretibial edema bilaterally  Abdomen:  Not distended,  soft, non-tender. No rebound or rigidity.   Skin: Exposed areas without rash. Not pale. Not jaundice Neurologic:  alert & oriented X3.  Speech normal, gait appropriate for age and unassisted Strength symmetric and appropriate for age.  Psych: Cognition and judgment appear intact.  Cooperative with normal attention span and concentration.  Behavior appropriate. No anxious or depressed appearing.     Assessment      Assessment Hypothyroidism Goiter: s/p USs in [place], last ~ 2013, s/p bx ~ 2011 (-); [place] 2016: slt enlarge w/o worrisome features h/o migraines Vitreous detachment 2011 Rheumatic fever as a child  PLAN: Here for CPX Hypothyroidism: Checking labs Goiter: Recheck ultrasound RTC 6 months     This visit occurred during the SARS-CoV-2 public health emergency.  Safety protocols were in place, including screening questions prior to the visit, additional usage of staff PPE, and extensive cleaning of exam room while observing appropriate contact time as indicated for disinfecting solutions.

## 2019-09-18 NOTE — Progress Notes (Signed)
Pre visit review using our clinic review tool, if applicable. No additional management support is needed unless otherwise documented below in the visit note. 

## 2019-09-18 NOTE — Assessment & Plan Note (Signed)
-   Td 2016.  - PNM 23: 06/2019 - PNM 13 : will d/w pt next year - had a flu shot - interested in the covid vaccine   -CCS: Cscope 2009, no polyps; 2020 referral  to GI failed d/t Covid, 3 options discussed, elected IFOB - Female care per gyn:  Had a mammogram and Pap smear around September 04, 2019 gynecology -T score -1.9 (09-2017) , rec  calcium and vitamin D. -  Active at home, exercises, eating healthy, + wt loss on her scales  -Labs:  CMP, FLP, CBC, TSH

## 2019-09-18 NOTE — Assessment & Plan Note (Signed)
Here for CPX Hypothyroidism: Checking labs Goiter: Recheck ultrasound RTC 6 months

## 2019-09-20 MED ORDER — LEVOTHYROXINE SODIUM 50 MCG PO TABS: ORAL_TABLET | ORAL | 1 refills | Status: DC

## 2019-09-20 MED ORDER — LEVOTHYROXINE SODIUM 75 MCG PO TABS: ORAL_TABLET | ORAL | 1 refills | Status: DC

## 2019-09-20 NOTE — Addendum Note (Signed)
Addended byDamita Dunnings D on: 09/20/2019 04:33 PM   Modules accepted: Orders

## 2019-09-23 ENCOUNTER — Other Ambulatory Visit: Payer: Self-pay

## 2019-09-23 ENCOUNTER — Ambulatory Visit (HOSPITAL_BASED_OUTPATIENT_CLINIC_OR_DEPARTMENT_OTHER)
Admission: RE | Admit: 2019-09-23 | Discharge: 2019-09-23 | Disposition: A | Discharge status: Home / Self Care | Payer: Medicare HMO | Source: Ambulatory Visit | Attending: Internal Medicine | Admitting: Internal Medicine

## 2019-09-23 ENCOUNTER — Telehealth: Payer: Self-pay | Admitting: Internal Medicine

## 2019-09-23 DIAGNOSIS — E049 Nontoxic goiter, unspecified: Secondary | ICD-10-CM | POA: Insufficient documentation

## 2019-09-23 NOTE — Telephone Encounter (Signed)
Patient would like to discuss thyroid medication with you, please call back at [phone].

## 2019-09-23 NOTE — Telephone Encounter (Signed)
(  Synthroid 75 mg daily was too much and now 50 mg daily is too little) Please call the patient: Cholesterol slightly elevated, watch diet closely. Needs to take slightly more Synthroid. Recommend Synthroid 75 mcg daily except Tuesday and Fridays take 50 mcg (total of 475 mcg weekly) Sent 2 prescriptions if appropriate, TSH in 6 weeks   Spoke w/ Pt- informed of above. Pt verbalized understanding. Offered to schedule her a lab appt. She will call back to schedule. TSH ordered.

## 2019-09-30 ENCOUNTER — Encounter: Payer: Self-pay | Admitting: Internal Medicine

## 2019-10-03 ENCOUNTER — Ambulatory Visit: Payer: Medicare HMO | Attending: Internal Medicine

## 2019-10-03 DIAGNOSIS — Z23 Encounter for immunization: Secondary | ICD-10-CM | POA: Insufficient documentation

## 2019-10-03 NOTE — Progress Notes (Signed)
   Covid-19 Vaccination Clinic  Name:  Shannon Mendez    MRN: [id number] DOB: [birth year]  10/03/2019  [name] was observed post Covid-19 immunization for 30 minutes based on pre-vaccination screening without incidence. She was provided with Vaccine Information Sheet and instruction to access the V-Safe system.   [name] was instructed to call 911 with any severe reactions post vaccine: Marland Kitchen Difficulty breathing  . Swelling of your face and throat  . A fast heartbeat  . A bad rash all over your body  . Dizziness and weakness    Immunizations Administered    Name Date Dose VIS Date Route   Pfizer COVID-19 Vaccine 10/03/2019 11:00 AM 0.3 mL 07/19/2019 Intramuscular   Manufacturer: [place]   Lot: [id number]   [place]: [id number]

## 2019-10-23 ENCOUNTER — Ambulatory Visit: Payer: Medicare HMO | Attending: Internal Medicine

## 2019-10-23 DIAGNOSIS — Z23 Encounter for immunization: Secondary | ICD-10-CM

## 2019-10-23 NOTE — Progress Notes (Signed)
   Covid-19 Vaccination Clinic  Name:  Shannon Mendez    MRN: [id number] DOB: [birth year]  10/23/2019  Shannon Mendez was observed post Covid-19 immunization for 30 minutes based on pre-vaccination screening without incident. She was provided with Vaccine Information Sheet and instruction to access the V-Safe system.   Shannon Mendez was instructed to call 911 with any severe reactions post vaccine: Marland Kitchen Difficulty breathing  . Swelling of face and throat  . A fast heartbeat  . A bad rash all over body  . Dizziness and weakness   Immunizations Administered    Name Date Dose VIS Date Route   Pfizer COVID-19 Vaccine 10/23/2019 12:48 PM 0.3 mL 07/19/2019 Intramuscular   Manufacturer: [place]   Lot: [id number]   [place]: [id number]

## 2019-10-28 ENCOUNTER — Other Ambulatory Visit: Payer: Self-pay

## 2019-10-28 ENCOUNTER — Other Ambulatory Visit (INDEPENDENT_AMBULATORY_CARE_PROVIDER_SITE_OTHER): Payer: Medicare HMO

## 2019-10-28 DIAGNOSIS — E039 Hypothyroidism, unspecified: Secondary | ICD-10-CM

## 2019-10-28 LAB — TSH: TSH: 1.33 u[IU]/mL (ref 0.35–4.50)

## 2019-10-28 MED ORDER — LEVOTHYROXINE SODIUM 50 MCG PO TABS: ORAL_TABLET | ORAL | 5 refills | Status: DC

## 2019-10-28 MED ORDER — LEVOTHYROXINE SODIUM 75 MCG PO TABS: ORAL_TABLET | ORAL | 5 refills | Status: DC

## 2019-10-28 NOTE — Addendum Note (Signed)
Addended byDamita Dunnings D on: 10/28/2019 03:08 PM   Modules accepted: Orders

## 2019-11-12 ENCOUNTER — Telehealth: Payer: Self-pay | Admitting: Internal Medicine

## 2019-11-12 MED ORDER — LEVOTHYROXINE SODIUM 75 MCG PO TABS: ORAL_TABLET | ORAL | 1 refills | Status: DC

## 2019-11-12 MED ORDER — LEVOTHYROXINE SODIUM 50 MCG PO TABS: ORAL_TABLET | ORAL | 1 refills | Status: DC

## 2019-11-12 NOTE — Telephone Encounter (Signed)
Caller : Shannon Mendez  Call Back # [phone] Concern :levothyroxine (SYNTHROID) 50 MCG tablet [id number]   Patient is calling in regards to her prescription levothyroxine, patient is wondering if she need additional testing in regards to thyroid.

## 2019-11-12 NOTE — Telephone Encounter (Signed)
Spoke w/ Pt- informed TSH normal on recheck on 10/28/2019- per PCP continue current doses of levothyroxine- and f/u in 6 months per [facility]. Pt verbalized understanding.

## 2019-12-05 ENCOUNTER — Other Ambulatory Visit: Payer: Self-pay

## 2019-12-05 ENCOUNTER — Ambulatory Visit (INDEPENDENT_AMBULATORY_CARE_PROVIDER_SITE_OTHER): Payer: Medicare HMO | Admitting: Internal Medicine

## 2019-12-05 ENCOUNTER — Encounter: Payer: Self-pay | Admitting: Internal Medicine

## 2019-12-05 VITALS — BP 116/67 | HR 58 | Temp 97.1°F | Resp 16 | Ht 64.0 in | Wt 141.1 lb

## 2019-12-05 DIAGNOSIS — G43009 Migraine without aura, not intractable, without status migrainosus: Secondary | ICD-10-CM | POA: Diagnosis not present

## 2019-12-05 MED ORDER — SUMATRIPTAN SUCCINATE 50 MG PO TABS: 50.0000 mg | ORAL_TABLET | ORAL | 0 refills | Status: DC | PRN

## 2019-12-05 NOTE — Patient Instructions (Addendum)
Please schedule Medicare Wellness with Glenard Haring.   If you know that you are developing a migraine:  Rest, fluids, Tylenol Also immediately take sumatriptan 50 mg 1 tablet. If in 2 hours the headache is not gone:  you may repeat another dose of sumatriptan. Do not take more than 2 sumatriptan tablets in a 24-hour.  If you have fever, chills, uncontrollable headache or a different headache: Call immediately.  [place] desk, please make an appointment for her daughter as a new patient

## 2019-12-05 NOTE — Progress Notes (Signed)
Pre visit review using our clinic review tool, if applicable. No additional management support is needed unless otherwise documented below in the visit note. 

## 2019-12-05 NOTE — Progress Notes (Signed)
Subjective:    Patient ID: Shannon Mendez, female    DOB: 12-Jun-1951, 69 y.o.   MRN: [id number]  DOS:  12/05/2019 Type of visit - description: Acute Has a long history of migraines. Last week had classic migraines but what was unusual is that she had one episode day, 4 days in a row .  The headaches typically start with aura described as lights before her right eye.  Shortly after she developed a headache. When the headache is gone she feels like a hangover.  Also she experienced a odd feeling at the right side of the face described as a spasm, lasted 20 seconds.  No associated stroke symptoms, see review of systems.    Review of Systems Denies fever chills No sinus pain or congestion. No nausea or vomiting No rash anywhere in the head or neck No neck stiffness No diplopia, slurred speech or motor deficits  Past Medical History:  Diagnosis Date  . Fibrous papule of nose 2016   Benign  . Goiter 08/15/2014  . History of chicken pox   . Hypothyroid   . Migraine   . Rheumatic fever    as a child  . Vitreous detachment    2011    Past Surgical History:  Procedure Laterality Date  . TONSILLECTOMY AND ADENOIDECTOMY      Allergies as of 12/05/2019      Reactions   Penicillins Hives   Amoxicillin Hives      Medication List       Accurate as of December 05, 2019 11:02 AM. If you have any questions, ask your nurse or doctor.        levothyroxine 75 MCG tablet Commonly known as: SYNTHROID Take 20mcg daily except on Tuesdays and Fridays take 54mcgs daily   levothyroxine 50 MCG tablet Commonly known as: SYNTHROID Take 29mcg on Tuesdays and Fridays and 37mcgs all other days.          Objective:   Physical Exam BP 116/67 (BP Location: Left Arm, Patient Position: Sitting, Cuff Size: Small)   Pulse (!) 58   Temp (!) 97.1 F (36.2 C) (Temporal)   Resp 16   Ht 5\' 4"  (1.626 m)   Wt 141 lb 2 oz (64 kg)   SpO2 99%   BMI 24.22 kg/m  General:   Well developed, NAD,  BMI noted. HEENT:  Normocephalic . Face symmetric, atraumatic Neck: Normal carotid pulses Lower extremities: no pretibial edema bilaterally  Skin: Not pale. Not jaundice Neurologic:  alert & oriented X3.  Speech normal, gait appropriate for age and unassisted. EOMI, pupils equal and reactive, motor and DTRs symmetric Psych--  Cognition and judgment appear intact.  Cooperative with normal attention span and concentration.  Behavior appropriate. No anxious or depressed appearing.      Assessment        Assessment Hypothyroidism Goiter: s/p USs in [place], last ~ 2013, s/p bx ~ 2011 (-); [place] 2016: slt enlarge w/o worrisome features Migraines, dx age 25s, ne recent XRs as off 11-2019 Vitreous detachment 2011 Rheumatic fever as a child  PLAN: Migraine headaches: Diagnosed in her 76s, no recent imaging to my knowledge, symptoms typically happen once a month. Last week she had a classic migraine, the only difference is that she had 4 episodes in a row. Today she is completely asymptomatic, neurological exam is benign. Multiple issues discussed: Recommend to watch for triggers and practice avoidance of known triggers. Topamax for prevention?  Because headaches and only once a  month she is not ready to take that step but she will let me know if changes her mind. Abortive medicine: The only thing she has taken so far is Tylenol, recommend the following for acute attacks: Rest, fluids, Tylenol and also sumatriptan.  See AVS.  Side effects discussed. Also to be alert, if she has a different type of headache or a severe episode she needs to seek medical attention. We talk about imaging, at this point I do not believe is mandatory since her symptoms are essentially stable.  Will consider imaging if symptoms changes. Preventive care: Had 2 Covid shots   This visit occurred during the SARS-CoV-2 public health emergency.  Safety protocols were in place, including screening questions prior to the  visit, additional usage of staff PPE, and extensive cleaning of exam room while observing appropriate contact time as indicated for disinfecting solutions.

## 2019-12-06 DIAGNOSIS — G43009 Migraine without aura, not intractable, without status migrainosus: Secondary | ICD-10-CM | POA: Insufficient documentation

## 2019-12-06 NOTE — Assessment & Plan Note (Signed)
Migraine headaches: Diagnosed in her 69s, no recent imaging to my knowledge, symptoms typically happen once a month. Last week she had a classic migraine, the only difference is that she had 4 episodes in a row. Today she is completely asymptomatic, neurological exam is benign. Multiple issues discussed: Recommend to watch for triggers and practice avoidance of known triggers. Topamax for prevention?  Because headaches and only once a month she is not ready to take that step but she will let me know if changes her mind. Abortive medicine: The only thing she has taken so far is Tylenol, recommend the following for acute attacks: Rest, fluids, Tylenol and also sumatriptan.  See AVS.  Side effects discussed. Also to be alert, if she has a different type of headache or a severe episode she needs to seek medical attention. We talk about imaging, at this point I do not believe is mandatory since her symptoms are essentially stable.  Will consider imaging if symptoms changes. Preventive care: Had 2 Covid shots

## 2020-01-05 ENCOUNTER — Other Ambulatory Visit: Payer: Self-pay | Admitting: Internal Medicine

## 2020-03-10 ENCOUNTER — Ambulatory Visit (INDEPENDENT_AMBULATORY_CARE_PROVIDER_SITE_OTHER): Payer: Medicare HMO | Admitting: Internal Medicine

## 2020-03-10 ENCOUNTER — Encounter: Payer: Self-pay | Admitting: Internal Medicine

## 2020-03-10 ENCOUNTER — Other Ambulatory Visit: Payer: Self-pay

## 2020-03-10 VITALS — BP 117/73 | HR 56 | Temp 98.1°F | Resp 16 | Ht 64.0 in | Wt 143.2 lb

## 2020-03-10 DIAGNOSIS — G43009 Migraine without aura, not intractable, without status migrainosus: Secondary | ICD-10-CM | POA: Diagnosis not present

## 2020-03-10 DIAGNOSIS — E039 Hypothyroidism, unspecified: Secondary | ICD-10-CM | POA: Diagnosis not present

## 2020-03-10 LAB — TSH: TSH: 0.18 u[IU]/mL — ABNORMAL LOW (ref 0.35–4.50)

## 2020-03-10 NOTE — Assessment & Plan Note (Signed)
Hypothyroidism: Currently on Synthroid 75 mcg daily except Tuesday and Fridays, those days she takes 50 mcg (total of 475 mcg weekly) Check TSH Migraines: They are back to baseline, headaches once a month on average , taking just Tylenol, she is reluctant to take sumatriptan which she has at home.  No further evaluation unless she has an uncommon, different migraine. Weight loss: Doing great with diet, decrease portion, less carbohydrates, more fruits.  Praised. RTC CPX 09/2020.Marland Kitchen

## 2020-03-10 NOTE — Progress Notes (Signed)
Pre visit review using our clinic review tool, if applicable. No additional management support is needed unless otherwise documented below in the visit note. 

## 2020-03-10 NOTE — Patient Instructions (Addendum)
Please schedule Medicare Wellness with Glenard Haring.   Please return the stool cards at your [facility] LAB : Get the blood work     [place], [place] back for a physical exam by 09-2020

## 2020-03-10 NOTE — Progress Notes (Signed)
Subjective:    Patient ID: Shannon Mendez, female    DOB: September 09, 1950, 69 y.o.   MRN: [id number]  DOS:  03/10/2020 Type of visit - description: Follow-up Since the last office visit she is doing great. + Weight loss, change her diet. Migraines are back to baseline. Good medication compliance with Synthroid.   Wt Readings from Last 3 Encounters:  03/10/20 143 lb 4 oz (65 kg)  12/05/19 141 lb 2 oz (64 kg)  09/18/19 146 lb 4 oz (66.3 kg)     Review of Systems See above   Past Medical History:  Diagnosis Date  . Fibrous papule of nose 2016   Benign  . Goiter 08/15/2014  . History of chicken pox   . Hypothyroid   . Migraine   . Rheumatic fever    as a child  . Vitreous detachment    2011    Past Surgical History:  Procedure Laterality Date  . TONSILLECTOMY AND ADENOIDECTOMY      Allergies as of 03/10/2020      Reactions   Penicillins Hives   Amoxicillin Hives      Medication List       Accurate as of March 10, 2020 10:08 AM. If you have any questions, ask your nurse or doctor.        levothyroxine 75 MCG tablet Commonly known as: SYNTHROID Take 77mcg daily except on Tuesdays and Fridays take 41mcgs daily   levothyroxine 50 MCG tablet Commonly known as: SYNTHROID Take 60mcg on Tuesdays and Fridays and 76mcgs all other days.   SUMAtriptan 50 MG tablet Commonly known as: IMITREX Take 1 tablet (50 mg total) by mouth every 2 (two) hours as needed for migraine. No more than 2 pills in a 24 hour period          Objective:   Physical Exam BP 117/73 (BP Location: Left Arm, Patient Position: Sitting, Cuff Size: Small)   Pulse (!) 56   Temp 98.1 F (36.7 C) (Oral)   Resp 16   Ht 5\' 4"  (1.626 m)   Wt 143 lb 4 oz (65 kg)   SpO2 98%   BMI 24.59 kg/m  General:   Well developed, NAD, BMI noted. HEENT:  Normocephalic . Face symmetric, atraumatic Lungs:  CTA B Normal respiratory effort, no intercostal retractions, no accessory muscle use. Heart: RRR,  no  murmur.  Lower extremities: no pretibial edema bilaterally  Skin: Not pale. Not jaundice Neurologic:  alert & oriented X3.  Speech normal, gait appropriate for age and unassisted Psych--  Cognition and judgment appear intact.  Cooperative with normal attention span and concentration.  Behavior appropriate. No anxious or depressed appearing.      Assessment        Assessment Hypothyroidism Goiter: s/p USs in [place], last ~ 2013, s/p bx ~ 2011 (-); [place] 2016: slt enlarge w/o worrisome features Migraines, dx age 63s, ne recent XRs as off 11-2019 Vitreous detachment 2011 Rheumatic fever as a child  PLAN: Hypothyroidism: Currently on Synthroid 75 mcg daily except Tuesday and Fridays, those days she takes 50 mcg (total of 475 mcg weekly) Check TSH Migraines: They are back to baseline, headaches once a month on average , taking just Tylenol, she is reluctant to take sumatriptan which she has at home.  No further evaluation unless she has an uncommon, different migraine. Weight loss: Doing great with diet, decrease portion, less carbohydrates, more fruits.  Praised. RTC CPX 09/2020..   This visit occurred during the  SARS-CoV-2 public health emergency.  Safety protocols were in place, including screening questions prior to the visit, additional usage of staff PPE, and extensive cleaning of exam room while observing appropriate contact time as indicated for disinfecting solutions.

## 2020-03-12 NOTE — Addendum Note (Signed)
Addended byDamita Dunnings D on: 03/12/2020 01:48 PM   Modules accepted: Orders

## 2020-03-20 ENCOUNTER — Ambulatory Visit: Payer: Medicare HMO | Admitting: Internal Medicine

## 2020-04-15 ENCOUNTER — Encounter: Payer: Self-pay | Admitting: Internal Medicine

## 2020-04-15 ENCOUNTER — Other Ambulatory Visit: Payer: Self-pay

## 2020-04-15 ENCOUNTER — Ambulatory Visit (INDEPENDENT_AMBULATORY_CARE_PROVIDER_SITE_OTHER): Payer: Medicare HMO | Admitting: Internal Medicine

## 2020-04-15 VITALS — BP 128/69 | HR 55 | Temp 98.2°F | Resp 16 | Ht 64.0 in | Wt 141.0 lb

## 2020-04-15 DIAGNOSIS — E039 Hypothyroidism, unspecified: Secondary | ICD-10-CM | POA: Diagnosis not present

## 2020-04-15 NOTE — Progress Notes (Signed)
Subjective:    Patient ID: Shannon Mendez, female    DOB: Aug 07, 1951, 69 y.o.   MRN: [id number]  DOS:  04/15/2020 Type of visit - description: acute Developed abdominal cramping and diarrhea approximately 10 days ago. Symptoms resolved 2 days ago after she held levothyroxine and  wonders if there is a relationship. Her levothyroxine dose was adjusted approximately 5 weeks ago.  Wt Readings from Last 3 Encounters:  04/15/20 141 lb (64 kg)  03/10/20 143 lb 4 oz (65 kg)  12/05/19 141 lb 2 oz (64 kg)     Review of Systems No fever chills Appetite is normal No nausea, vomiting, diarrhea.  Past Medical History:  Diagnosis Date  . Fibrous papule of nose 2016   Benign  . Goiter 08/15/2014  . History of chicken pox   . Hypothyroid   . Migraine   . Rheumatic fever    as a child  . Vitreous detachment    2011    Past Surgical History:  Procedure Laterality Date  . TONSILLECTOMY AND ADENOIDECTOMY      Allergies as of 04/15/2020      Reactions   Penicillins Hives   Amoxicillin Hives      Medication List       Accurate as of April 15, 2020  9:14 AM. If you have any questions, ask your nurse or doctor.        levothyroxine 75 MCG tablet Commonly known as: SYNTHROID Take 81mcg daily except on Tuesdays and Fridays take 34mcgs daily   levothyroxine 50 MCG tablet Commonly known as: SYNTHROID Take 86mcg on Tuesdays and Fridays and 64mcgs all other days.   SUMAtriptan 50 MG tablet Commonly known as: IMITREX Take 1 tablet (50 mg total) by mouth every 2 (two) hours as needed for migraine. No more than 2 pills in a 24 hour period          Objective:   Physical Exam BP 128/69 (BP Location: Left Arm, Patient Position: Sitting, Cuff Size: Small)   Pulse (!) 55   Temp 98.2 F (36.8 C) (Oral)   Resp 16   Ht 5\' 4"  (1.626 m)   Wt 141 lb (64 kg)   SpO2 97%   BMI 24.20 kg/m  General:   Well developed, NAD, BMI noted.  HEENT:  Normocephalic . Face symmetric,  atraumatic Abdomen:  Not distended, soft, non-tender. No rebound or rigidity.   Skin: Not pale. Not jaundice Lower extremities: no pretibial edema bilaterally  Neurologic:  alert & oriented X3.  Speech normal, gait appropriate for age and unassisted Psych--  Cognition and judgment appear intact.  Cooperative with normal attention span and concentration.  Behavior appropriate. No anxious or depressed appearing.     Assessment      Assessment Hypothyroidism Goiter: s/p USs in [place], last ~ 2013, s/p bx ~ 2011 (-); [place] 09-2019 stable. Migraines, dx age 7s, ne recent XRs as off 11-2019 Vitreous detachment 2011 Rheumatic fever as a child  PLAN: Diarrhea: As described above, resolved  2 days ago, suspect a self-limited illness (other people in the community had diarrhea in the last few weeks).  Recommend good hydration and observation.  Doubt diarrhea related to thyroid medication as patient believes. Hypothyroidism: Last TSH is slightly low, thyroid dose decreased from 475 mcg daily to 450 mcg daily (50 mcg x 3, 75 mcg x 4).  Check a TSH. RTC CPX 09/2020.    This visit occurred during the SARS-CoV-2 public health emergency.  Safety  protocols were in place, including screening questions prior to the visit, additional usage of staff PPE, and extensive cleaning of exam room while observing appropriate contact time as indicated for disinfecting solutions.  

## 2020-04-15 NOTE — Patient Instructions (Addendum)
Drink plenty of fluids, if the diarrhea came back let me know.   GO TO THE LAB : Get the blood work     [place] Come back for a physical by 09/2020

## 2020-04-15 NOTE — Progress Notes (Signed)
Pre visit review using our clinic review tool, if applicable. No additional management support is needed unless otherwise documented below in the visit note. 

## 2020-04-16 LAB — TSH: TSH: 0.79 mIU/L (ref 0.40–4.50)

## 2020-04-16 NOTE — Assessment & Plan Note (Signed)
Diarrhea: As described above, resolved  2 days ago, suspect a self-limited illness (other people in the community had diarrhea in the last few weeks).  Recommend good hydration and observation.  Doubt diarrhea related to thyroid medication as patient believes. Hypothyroidism: Last TSH is slightly low, thyroid dose decreased from 475 mcg daily to 450 mcg daily (50 mcg x 3, 75 mcg x 4).  Check a TSH. RTC CPX 09/2020.

## 2020-04-28 ENCOUNTER — Other Ambulatory Visit: Payer: Medicare HMO

## 2020-06-01 ENCOUNTER — Encounter: Payer: Self-pay | Admitting: Internal Medicine

## 2020-06-15 ENCOUNTER — Ambulatory Visit: Payer: Medicare HMO | Attending: Internal Medicine

## 2020-06-15 DIAGNOSIS — Z23 Encounter for immunization: Secondary | ICD-10-CM

## 2020-06-15 NOTE — Progress Notes (Signed)
   Covid-[facility] Vaccination [facility]  Name:  Shannon Mendez    MRN: [id number] DOB: 1951-03-28  06/15/2020  [name] was observed post Covid-19 immunization for 15 minutes without incident. She was provided with Vaccine Information Sheet and instruction to access the V-Safe system.   [name] was instructed to call 911 with any severe reactions post vaccine: Marland Kitchen Difficulty breathing  . Swelling of face and throat  . A fast heartbeat  . A bad rash all over body  . Dizziness and weakness

## 2020-07-08 DIAGNOSIS — H524 Presbyopia: Secondary | ICD-10-CM | POA: Diagnosis not present

## 2020-07-08 DIAGNOSIS — H52223 Regular astigmatism, bilateral: Secondary | ICD-10-CM | POA: Diagnosis not present

## 2020-07-20 ENCOUNTER — Other Ambulatory Visit: Payer: Self-pay

## 2020-07-20 ENCOUNTER — Encounter: Payer: Self-pay | Admitting: Internal Medicine

## 2020-07-20 ENCOUNTER — Ambulatory Visit (INDEPENDENT_AMBULATORY_CARE_PROVIDER_SITE_OTHER): Payer: Medicare HMO | Admitting: Internal Medicine

## 2020-07-20 VITALS — BP 124/73 | HR 61 | Temp 98.3°F | Resp 18 | Ht 64.0 in | Wt 145.1 lb

## 2020-07-20 DIAGNOSIS — Z23 Encounter for immunization: Secondary | ICD-10-CM

## 2020-07-20 DIAGNOSIS — M25512 Pain in left shoulder: Secondary | ICD-10-CM

## 2020-07-20 MED ORDER — MELOXICAM 7.5 MG PO TABS: 7.5000 mg | ORAL_TABLET | Freq: Every day | ORAL | 0 refills | Status: DC | PRN

## 2020-07-20 NOTE — Progress Notes (Signed)
Subjective:    Patient ID: Shannon Mendez, female    DOB: [birth year], 69 y.o.   MRN: [id number]  DOS:  07/20/2020 Type of visit - description: Acute Symptoms started 4 weeks ago. She has pain, sometimes intense, at the lateral aspect of the left deltoid area mostly when she reaches back or elevate her left arm.  Denies any injury.  No neck pain or upper extremity paresthesias. No swelling or redness.  Review of Systems See above   Past Medical History:  Diagnosis Date  . Fibrous papule of nose 2016   Benign  . Goiter 08/15/2014  . History of chicken pox   . Hypothyroid   . Migraine   . Rheumatic fever    as a child  . Vitreous detachment    2011    Past Surgical History:  Procedure Laterality Date  . TONSILLECTOMY AND ADENOIDECTOMY      Allergies as of 07/20/2020      Reactions   Penicillins Hives   Amoxicillin Hives      Medication List       Accurate as of July 20, 2020  3:36 PM. If you have any questions, ask your nurse or doctor.        levothyroxine 75 MCG tablet Commonly known as: SYNTHROID Take 64mcg daily except on Tuesdays and Fridays take 69mcgs daily   levothyroxine 50 MCG tablet Commonly known as: SYNTHROID Take 101mcg on Tuesdays and Fridays and 59mcgs all other days.   SUMAtriptan 50 MG tablet Commonly known as: IMITREX Take 1 tablet (50 mg total) by mouth every 2 (two) hours as needed for migraine. No more than 2 pills in a 24 hour period          Objective:   Physical Exam BP 124/73 (BP Location: Right Arm, Patient Position: Sitting, Cuff Size: Small)   Pulse 61   Temp 98.3 F (36.8 C) (Oral)   Resp 18   Ht 5\' 4"  (1.626 m)   Wt 145 lb 2 oz (65.8 kg)   SpO2 100%   BMI 24.91 kg/m  General:   Well developed, NAD, BMI noted. HEENT:  Normocephalic . Face symmetric, atraumatic MSK: Inspection and palpation of the shoulders normal.  Motor symmetric bilaterally. Right shoulder: Range of motion normal Left shoulder: Pain at  the deltoid area with passive and active arm elevation. Lower extremities: no pretibial edema bilaterally  Skin: Not pale. Not jaundice Neurologic:  alert & oriented X3.  Speech normal, gait appropriate for age and unassisted Psych--  Cognition and judgment appear intact.  Cooperative with normal attention span and concentration.  Behavior appropriate. No anxious or depressed appearing.      Assessment      Assessment Hypothyroidism Goiter: s/p USs in [place], last ~ 2013, s/p bx ~ 2011 (-); [place] 09-2019 stable. Migraines, dx age 25s, ne recent XRs as off 11-2019 Vitreous detachment 2011 Rheumatic fever as a child  PLAN: L shoulder pain: Denies intra-articular pain, she seems to have a deltoid sprain.  Rec,  anti-inflammatories with GI precautions for 10 days, will try meloxicam which worked really well for her in the past.  If no better she will let me know.  See AVS. Immunizations: unsure about the  flu shot, pro cons discussed, elected to proceed      This visit occurred during the SARS-CoV-2 public health emergency.  Safety protocols were in place, including screening questions prior to the visit, additional usage of staff PPE, and extensive cleaning of  exam room while observing appropriate contact time as indicated for disinfecting solutions.

## 2020-07-20 NOTE — Patient Instructions (Signed)
For the arm pain, take meloxicam 7.5 mg: 1 or 2 tablets a day for 1 week, then as needed.   Always take it with food because may cause gastritis and ulcers.  If you notice nausea, stomach pain, change in the color of stools --->  Stop the medicine and let us know  Call if no better

## 2020-07-20 NOTE — Progress Notes (Signed)
Pre visit review using our clinic review tool, if applicable. No additional management support is needed unless otherwise documented below in the visit note. 

## 2020-07-21 NOTE — Assessment & Plan Note (Signed)
L shoulder pain: Denies intra-articular pain, she seems to have a deltoid sprain.  Rec,  anti-inflammatories with GI precautions for 10 days, will try meloxicam which worked really well for her in the past.  If no better she will let me know.  See AVS. Immunizations: unsure about the  flu shot, pro cons discussed, elected to proceed

## 2020-08-07 ENCOUNTER — Other Ambulatory Visit: Payer: Self-pay | Admitting: Internal Medicine

## 2020-08-10 MED ORDER — LEVOTHYROXINE SODIUM 50 MCG PO TABS: ORAL_TABLET | ORAL | 1 refills | Status: DC

## 2020-09-01 ENCOUNTER — Telehealth: Payer: Self-pay | Admitting: Internal Medicine

## 2020-09-01 DIAGNOSIS — M25512 Pain in left shoulder: Secondary | ICD-10-CM

## 2020-09-01 NOTE — Telephone Encounter (Signed)
Patient states meloxicam is not working. She don't want to figure out what is wrong with her arm. Was wondering what kind of testing could be done?

## 2020-09-01 NOTE — Telephone Encounter (Signed)
Referral placed to Dr. French Ana at [facility].

## 2020-09-01 NOTE — Telephone Encounter (Signed)
Yes, refer to Ortho please

## 2020-09-01 NOTE — Telephone Encounter (Signed)
LMOM informing referral has been placed.

## 2020-09-01 NOTE — Telephone Encounter (Signed)
Ortho referral  

## 2020-09-17 DIAGNOSIS — M25522 Pain in left elbow: Secondary | ICD-10-CM | POA: Diagnosis not present

## 2020-09-17 DIAGNOSIS — M25512 Pain in left shoulder: Secondary | ICD-10-CM | POA: Diagnosis not present

## 2020-09-23 DIAGNOSIS — Z0100 Encounter for examination of eyes and vision without abnormal findings: Secondary | ICD-10-CM | POA: Diagnosis not present

## 2020-09-23 NOTE — Telephone Encounter (Signed)
Patient would like to speak to you in regards to the orthopaedic. (would not elaborate)

## 2020-09-23 NOTE — Telephone Encounter (Signed)
Spoke w/ Pt- she saw Dr. Alvester Morin PA last week, she is scheduled for an MRI on Friday. She informed that the PA was going to send in a muscle relaxer in the meantime for her shoulder but her pharmacy never received it, she has called the office there three times but has not gotten a call back. I offered to see if PCP would send in a muscle relaxer in the meantime but she states she has decided not to do the muscle relaxer- she just wanted advice. I informed that she has to remember that orthopedic surgeons maybe in surgery 2-3 days out of the week to be patient with them. I recommended she keep MRI since it has already been approved by insurance and scheduled for Friday however if she keeps having issues with the office we can always send her to a different ortho office if needed. Pt verbalized understanding and thanked me for the call.

## 2020-09-25 DIAGNOSIS — M25512 Pain in left shoulder: Secondary | ICD-10-CM | POA: Diagnosis not present

## 2020-09-29 DIAGNOSIS — M25512 Pain in left shoulder: Secondary | ICD-10-CM | POA: Diagnosis not present

## 2020-09-30 ENCOUNTER — Other Ambulatory Visit: Payer: Self-pay

## 2020-10-01 ENCOUNTER — Encounter: Payer: Self-pay | Admitting: Internal Medicine

## 2020-10-01 ENCOUNTER — Ambulatory Visit (INDEPENDENT_AMBULATORY_CARE_PROVIDER_SITE_OTHER): Payer: Medicare HMO | Admitting: Internal Medicine

## 2020-10-01 VITALS — BP 128/64 | HR 68 | Temp 98.0°F | Resp 16 | Ht 64.0 in | Wt 145.1 lb

## 2020-10-01 DIAGNOSIS — Z23 Encounter for immunization: Secondary | ICD-10-CM

## 2020-10-01 DIAGNOSIS — Z0000 Encounter for general adult medical examination without abnormal findings: Secondary | ICD-10-CM

## 2020-10-01 DIAGNOSIS — E039 Hypothyroidism, unspecified: Secondary | ICD-10-CM

## 2020-10-01 LAB — CBC WITH DIFFERENTIAL/PLATELET
Basophils Absolute: 0.1 10*3/uL (ref 0.0–0.1)
Basophils Relative: 1 % (ref 0.0–3.0)
Eosinophils Absolute: 0.1 10*3/uL (ref 0.0–0.7)
Eosinophils Relative: 0.8 % (ref 0.0–5.0)
HCT: 42.9 % (ref 36.0–46.0)
Hemoglobin: 14.4 g/dL (ref 12.0–15.0)
Lymphocytes Relative: 29.3 % (ref 12.0–46.0)
Lymphs Abs: 2.1 10*3/uL (ref 0.7–4.0)
MCHC: 33.6 g/dL (ref 30.0–36.0)
MCV: 91.8 fl (ref 78.0–100.0)
Monocytes Absolute: 0.5 10*3/uL (ref 0.1–1.0)
Monocytes Relative: 6.9 % (ref 3.0–12.0)
Neutro Abs: 4.4 10*3/uL (ref 1.4–7.7)
Neutrophils Relative %: 62 % (ref 43.0–77.0)
Platelets: 251 10*3/uL (ref 150.0–400.0)
RBC: 4.68 Mil/uL (ref 3.87–5.11)
RDW: 13.4 % (ref 11.5–15.5)
WBC: 7.1 10*3/uL (ref 4.0–10.5)

## 2020-10-01 LAB — COMPREHENSIVE METABOLIC PANEL
ALT: 12 U/L (ref 0–35)
AST: 15 U/L (ref 0–37)
Albumin: 4.3 g/dL (ref 3.5–5.2)
Alkaline Phosphatase: 90 U/L (ref 39–117)
BUN: 18 mg/dL (ref 6–23)
CO2: 31 mEq/L (ref 19–32)
Calcium: 9.4 mg/dL (ref 8.4–10.5)
Chloride: 104 mEq/L (ref 96–112)
Creatinine, Ser: 0.86 mg/dL (ref 0.40–1.20)
GFR: 69.49 mL/min (ref >60.00)
Glucose, Bld: 83 mg/dL (ref 70–99)
Potassium: 4.6 mEq/L (ref 3.5–5.1)
Sodium: 141 mEq/L (ref 135–145)
Total Bilirubin: 0.8 mg/dL (ref 0.2–1.2)
Total Protein: 7.1 g/dL (ref 6.0–8.3)

## 2020-10-01 LAB — TSH: TSH: 2.88 u[IU]/mL (ref 0.35–4.50)

## 2020-10-01 LAB — LIPID PANEL
Cholesterol: 227 mg/dL — ABNORMAL HIGH (ref 0–200)
HDL: 52.8 mg/dL (ref >39.00)
LDL Cholesterol: 144 mg/dL — ABNORMAL HIGH (ref 0–99)
NonHDL: 174.12
Total CHOL/HDL Ratio: 4
Triglycerides: 150 mg/dL — ABNORMAL HIGH (ref 0.0–149.0)
VLDL: 30 mg/dL (ref 0.0–40.0)

## 2020-10-01 NOTE — Progress Notes (Signed)
Pre visit review using our clinic review tool, if applicable. No additional management support is needed unless otherwise documented below in the visit note. 

## 2020-10-01 NOTE — Progress Notes (Signed)
Subjective:    Patient ID: Shannon Mendez, female    DOB: 03/11/1951, 70 y.o.   MRN: [id number]  DOS:  10/01/2020 Type of visit - description: CPX, here with her husband  In general feels well except for left shoulder pain which is persistent. Saw Ortho already.  Review of Systems No recent falls, remains very active  Other than above, a 14 point review of systems is negative      Past Medical History:  Diagnosis Date  . Fibrous papule of nose 2016   Benign  . Goiter 08/15/2014  . History of chicken pox   . Hypothyroid   . Migraine   . Rheumatic fever    as a child  . Vitreous detachment    2011    Past Surgical History:  Procedure Laterality Date  . TONSILLECTOMY AND ADENOIDECTOMY      Allergies as of 10/01/2020      Reactions   Penicillins Hives   Amoxicillin Hives      Medication List       Accurate as of October 01, 2020 11:59 PM. If you have any questions, ask your nurse or doctor.        STOP taking these medications   meloxicam 7.5 MG tablet Commonly known as: MOBIC Stopped by: Kathlene November, MD     TAKE these medications   levothyroxine 75 MCG tablet Commonly known as: SYNTHROID TAKE 75MCG DAILY EXCEPT ON TUESDAYS AND FRIDAYS TAKE 50MCGS DAILY What changed: See the new instructions.   levothyroxine 50 MCG tablet Commonly known as: SYNTHROID Take 22mcg on Tuesdays and Fridays and 7mcgs all other days. What changed: additional instructions   SUMAtriptan 50 MG tablet Commonly known as: IMITREX Take 1 tablet (50 mg total) by mouth every 2 (two) hours as needed for migraine. No more than 2 pills in a 24 hour period          Objective:   Physical Exam BP 128/64 (BP Location: Right Arm, Patient Position: Sitting, Cuff Size: Small)   Pulse 68   Temp 98 F (36.7 C) (Oral)   Resp 16   Ht 5\' 4"  (1.626 m)   Wt 145 lb 2 oz (65.8 kg)   SpO2 98%   BMI 24.91 kg/m  General: Well developed, NAD, BMI noted Neck: Minimally enlarged thyroid gland,  not nodular, not tender HEENT:  Normocephalic . Face symmetric, atraumatic Lungs:  CTA B Normal respiratory effort, no intercostal retractions, no accessory muscle use. Heart: RRR,  no murmur.  Abdomen:  Not distended, soft, non-tender. No rebound or rigidity.   Lower extremities: no pretibial edema bilaterally  Skin: Exposed areas without rash. Not pale. Not jaundice Neurologic:  alert & oriented X3.  Speech normal, gait appropriate for age and unassisted Strength symmetric and appropriate for age.  Psych: Cognition and judgment appear intact.  Cooperative with normal attention span and concentration.  Behavior appropriate. No anxious or depressed appearing.     Assessment    Assessment Hypothyroidism Goiter: s/p USs in [place], last ~ 2013, s/p bx ~ 2011 (-); [place] 09-2019 stable. Migraines, dx age 43s, ne recent XRs as off 11-2019 Vitreous detachment 2011 Rheumatic fever as a child  PLAN: Here for CPX Hypothyroidism: Alternate levothyroxine 50 and 75 mcg. Checking a TSH. Consider recheck TSH before her next physical. Migraines, not an issue during the wintertime. Frozen shoulder: See last visit, left shoulder persisted, meloxicam did not help, MRI done, DX frozen shoulder, to get a local injection and  PT soon.  F/U by Ortho. RTC 1 year CPX   This visit occurred during the SARS-CoV-2 public health emergency.  Safety protocols were in place, including screening questions prior to the visit, additional usage of staff PPE, and extensive cleaning of exam room while observing appropriate contact time as indicated for disinfecting solutions.

## 2020-10-01 NOTE — Patient Instructions (Addendum)
Return the stool test kit at your earliest convenience Please consider getting the shingles vaccination at your local pharmacy ([facility])  GO TO THE LAB : Get the blood work    [place], [place] back for   a physical exam in 1 year    Advance Directive  Advance directives are legal documents that allow you to make decisions about your health care and medical treatment in case you become unable to communicate for yourself. Advance directives let your wishes be known to family, friends, and health care providers. Discussing and writing advance directives should happen over time rather than all at once. Advance directives can be changed and updated at any time. There are different types of advance directives, such as:  Medical power of attorney.  Living will.  Do not resuscitate (DNR) order or do not attempt resuscitation (DNAR) order. Health care proxy and medical power of attorney A health care proxy is also called a health care agent. This person is appointed to make medical decisions for you when you are unable to make decisions for yourself. Generally, people ask a trusted friend or family member to act as their proxy and represent their preferences. Make sure you have an agreement with your trusted person to act as your proxy. A proxy may have to make a medical decision on your behalf if your wishes are not known. A medical power of attorney, also called a durable power of attorney for health care, is a legal document that names your health care proxy. Depending on the laws in your state, the document may need to be:  Signed.  Notarized.  Dated.  Copied.  Witnessed.  Incorporated into your medical record. You may also want to appoint a trusted person to manage your money in the event you are unable to do so. This is called a durable power of attorney for finances. It is a separate legal document from the durable power of attorney for  health care. You may choose your health care proxy or someone different to act as your agent in money matters. If you do not appoint a proxy, or there is a concern that the proxy is not acting in your best interest, a court may appoint a guardian to act on your behalf. Living will A living will is a set of instructions that state your wishes about medical care when you cannot express them yourself. Health care providers should keep a copy of your living will in your medical record. You may want to give a copy to family members or friends. To alert caregivers in case of an emergency, you can place a card in your wallet to let them know that you have a living will and where they can find it. A living will is used if you become:  Terminally ill.  Disabled.  Unable to communicate or make decisions. The following decisions should be included in your living will:  To use or not to use life support equipment, such as dialysis machines and breathing machines (ventilators).  Whether you want a DNR or DNAR order. This tells health care providers not to use cardiopulmonary resuscitation (CPR) if breathing or heartbeat stops.  To use or not to use tube feeding.  To be given or not to be given food and fluids.  Whether you want comfort (palliative) care when the goal becomes comfort rather than a cure.  Whether you want to donate your organs and tissues. A living  will does not give instructions for distributing your money and property if you should pass away. DNR or DNAR A DNR or DNAR order is a request not to have CPR in the event that your heart stops beating or you stop breathing. If a DNR or DNAR order has not been made and shared, a health care provider will try to help any patient whose heart has stopped or who has stopped breathing. If you plan to have surgery, talk with your health care provider about how your DNR or DNAR order will be followed if problems occur. What if I do not have an advance  directive? Some states assign family decision makers to act on your behalf if you do not have an advance directive. Each state has its own laws about advance directives. You may want to check with your health care provider, attorney, or state representative about the laws in your state. Summary  Advance directives are legal documents that allow you to make decisions about your health care and medical treatment in case you become unable to communicate for yourself.  The process of discussing and writing advance directives should happen over time. You can change and update advance directives at any time.  Advance directives may include a medical power of attorney, a living will, and a DNR or DNAR order. This information is not intended to replace advice given to you by your health care provider. Make sure you discuss any questions you have with your health care provider. Document Revised: 04/28/2020 Document Reviewed: 04/28/2020 Elsevier Patient Education  2021 [employer].

## 2020-10-04 ENCOUNTER — Encounter: Payer: Self-pay | Admitting: Internal Medicine

## 2020-10-04 NOTE — Assessment & Plan Note (Signed)
-   Td 2016.  -[facility]: rec to proceed @ her pharmacy  - PNM 23: 06/2019; PNM 13 :10/04/2020 -COVID VAX x3 - had a flu shot  -CCS: Cscope 2009, no polyps; 2020 referral  to GI failed d/t Covid, see previous visit, did not return IFOB, plans to do this time - Female care per gyn:  PAP & MMG 09/04/2019 (K PN), next gyn OV 10-2020 -T score -1.9 (09-2017) , rec  calcium and vitamin D. -Lifestyle: She remains very active, walks an hour almost daily. -Labs:  CMP, FLP, CBC, TSH, I fob

## 2020-10-04 NOTE — Assessment & Plan Note (Signed)
Here for CPX Hypothyroidism: Alternate levothyroxine 50 and 75 mcg. Checking a TSH. Consider recheck TSH before her next physical. Migraines, not an issue during the wintertime. Frozen shoulder: See last visit, left shoulder persisted, meloxicam did not help, MRI done, DX frozen shoulder, to get a local injection and PT soon.  F/U by Ortho. RTC 1 year CPX

## 2020-10-12 DIAGNOSIS — M25512 Pain in left shoulder: Secondary | ICD-10-CM | POA: Diagnosis not present

## 2020-10-13 DIAGNOSIS — M7502 Adhesive capsulitis of left shoulder: Secondary | ICD-10-CM | POA: Diagnosis not present

## 2020-10-13 DIAGNOSIS — M25512 Pain in left shoulder: Secondary | ICD-10-CM | POA: Diagnosis not present

## 2020-10-13 DIAGNOSIS — M6281 Muscle weakness (generalized): Secondary | ICD-10-CM | POA: Diagnosis not present

## 2020-10-13 DIAGNOSIS — M25612 Stiffness of left shoulder, not elsewhere classified: Secondary | ICD-10-CM | POA: Diagnosis not present

## 2020-10-16 DIAGNOSIS — M25512 Pain in left shoulder: Secondary | ICD-10-CM | POA: Diagnosis not present

## 2020-10-16 DIAGNOSIS — M6281 Muscle weakness (generalized): Secondary | ICD-10-CM | POA: Diagnosis not present

## 2020-10-16 DIAGNOSIS — M7502 Adhesive capsulitis of left shoulder: Secondary | ICD-10-CM | POA: Diagnosis not present

## 2020-10-16 DIAGNOSIS — M25612 Stiffness of left shoulder, not elsewhere classified: Secondary | ICD-10-CM | POA: Diagnosis not present

## 2020-10-21 DIAGNOSIS — Z1231 Encounter for screening mammogram for malignant neoplasm of breast: Secondary | ICD-10-CM | POA: Diagnosis not present

## 2020-10-21 DIAGNOSIS — Z6825 Body mass index (BMI) 25.0-25.9, adult: Secondary | ICD-10-CM | POA: Diagnosis not present

## 2020-10-21 DIAGNOSIS — Z124 Encounter for screening for malignant neoplasm of cervix: Secondary | ICD-10-CM | POA: Diagnosis not present

## 2020-10-21 LAB — HM MAMMOGRAPHY

## 2020-10-21 LAB — HM PAP SMEAR: HM Pap smear: NEGATIVE

## 2020-11-03 ENCOUNTER — Encounter: Payer: Self-pay | Admitting: Internal Medicine

## 2020-11-09 ENCOUNTER — Other Ambulatory Visit (INDEPENDENT_AMBULATORY_CARE_PROVIDER_SITE_OTHER): Payer: Medicare HMO

## 2020-11-09 DIAGNOSIS — Z0000 Encounter for general adult medical examination without abnormal findings: Secondary | ICD-10-CM

## 2020-11-09 LAB — FECAL OCCULT BLOOD, IMMUNOCHEMICAL: Fecal Occult Bld: NEGATIVE

## 2020-11-18 ENCOUNTER — Encounter: Payer: Self-pay | Admitting: Internal Medicine

## 2020-12-01 ENCOUNTER — Telehealth (INDEPENDENT_AMBULATORY_CARE_PROVIDER_SITE_OTHER): Payer: Medicare HMO | Admitting: Internal Medicine

## 2020-12-01 DIAGNOSIS — J069 Acute upper respiratory infection, unspecified: Secondary | ICD-10-CM | POA: Diagnosis not present

## 2020-12-01 DIAGNOSIS — Z09 Encounter for follow-up examination after completed treatment for conditions other than malignant neoplasm: Secondary | ICD-10-CM

## 2020-12-01 NOTE — Progress Notes (Signed)
Subjective:    Patient ID: Shannon Mendez, female    DOB: Sep 26, 1950, 70 y.o.   MRN: [id number]  DOS:  12/01/2020 Type of visit - description: Virtual Visit via Telephone    I connected with above mentioned patient  by telephone and verified that I am speaking with the correct person using two identifiers.  THIS ENCOUNTER IS A VIRTUAL VISIT DUE TO COVID-19 - PATIENT WAS NOT SEEN IN THE OFFICE. PATIENT HAS CONSENTED TO VIRTUAL VISIT / TELEMEDICINE VISIT   Location of patient: home  Location of provider: office  Persons participating in the virtual visit: patient, provider   I discussed the limitations, risks, security and privacy concerns of performing an evaluation and management service by telephone and the availability of in person appointments. I also discussed with the patient that there may be a patient responsible charge related to this service. The patient expressed understanding and agreed to proceed.  Acute Developed mild sore throat last night, throat does not look red. She also has a dry cough. She wonders if this is allergies or COVID and what she can do about it.  Denies fever chills No myalgias No nausea, vomiting, diarrhea No wheezing No watery eyes, itchy eyes or sneezing.   Review of Systems See above   Past Medical History:  Diagnosis Date  . Fibrous papule of nose 2016   Benign  . Goiter 08/15/2014  . History of chicken pox   . Hypothyroid   . Migraine   . Rheumatic fever    as a child  . Vitreous detachment    2011    Past Surgical History:  Procedure Laterality Date  . TONSILLECTOMY AND ADENOIDECTOMY      Allergies as of 12/01/2020      Reactions   Penicillins Hives   Amoxicillin Hives      Medication List       Accurate as of December 01, 2020  4:55 PM. If you have any questions, ask your nurse or doctor.        levothyroxine 75 MCG tablet Commonly known as: SYNTHROID TAKE 75MCG DAILY EXCEPT ON TUESDAYS AND FRIDAYS TAKE 50MCGS  DAILY What changed: See the new instructions.   levothyroxine 50 MCG tablet Commonly known as: SYNTHROID Take 96mcg on Tuesdays and Fridays and 5mcgs all other days. What changed: additional instructions   SUMAtriptan 50 MG tablet Commonly known as: IMITREX Take 1 tablet (50 mg total) by mouth every 2 (two) hours as needed for migraine. No more than 2 pills in a 24 hour period          Objective:   Physical Exam There were no vitals taken for this visit. This is a telephone  visit, alert oriented x3, in no distress. No ambulatory BP     Assessment     Assessment Hypothyroidism Goiter: s/p USs in [place], last ~ 2013, s/p bx ~ 2011 (-); [place] 09-2019 stable. Migraines, dx age 69s, ne recent XRs as off 11-2019 Vitreous detachment 2011 Rheumatic fever as a child  PLAN: Upper respiratory symptoms: As described above, DDx allergies, URI, COVID, others. She did not sound toxic, she had 3 COVID vaccinations. Recommend: Get tested for COVID at home or preferably with a PCR somewhere else.  Let me know the results ASAP For now is okay to take Claritin, Robitussin-DM or Mucinex DM and Flonase if she develops nasal congestion. Definitely call or seek medical attention if symptoms get much worse.     I discussed the  assessment and treatment plan with the patient. The patient was provided an opportunity to ask questions and all were answered. The patient agreed with the plan and demonstrated an understanding of the instructions.   The patient was advised to call back or seek an in-person evaluation if the symptoms worsen or if the condition fails to improve as anticipated.  I provided 15 minutes of non-face-to-face time during this encounter.  Kathlene November, MD

## 2020-12-02 NOTE — Assessment & Plan Note (Signed)
Upper respiratory symptoms: As described above, DDx allergies, URI, COVID, others. She did not sound toxic, she had 3 COVID vaccinations. Recommend: Get tested for COVID at home or preferably with a PCR somewhere else.  Let me know the results ASAP For now is okay to take Claritin, Robitussin-DM or Mucinex DM and Flonase if she develops nasal congestion. Definitely call or seek medical attention if symptoms get much worse.

## 2020-12-03 ENCOUNTER — Telehealth: Payer: Self-pay | Admitting: Internal Medicine

## 2020-12-03 ENCOUNTER — Other Ambulatory Visit (HOSPITAL_BASED_OUTPATIENT_CLINIC_OR_DEPARTMENT_OTHER): Payer: Self-pay

## 2020-12-03 ENCOUNTER — Encounter: Payer: Self-pay | Admitting: Internal Medicine

## 2020-12-03 MED ORDER — NIRMATRELVIR/RITONAVIR (PAXLOVID)TABLET
3.0000 | ORAL_TABLET | Freq: Two times a day (BID) | ORAL | 0 refills | Status: AC
  Filled 2020-12-03: qty 30, 5d supply, fill #0

## 2020-12-03 NOTE — Telephone Encounter (Signed)
LMOM

## 2020-12-03 NOTE — Telephone Encounter (Signed)
FYI

## 2020-12-03 NOTE — Telephone Encounter (Signed)
Patient tested positive for COVID. Symptoms started 3-1/2 days ago. Currently with some nasal congestion, last time she had fever yesterday: Temperature 101.0. Denies chest pain or difficulty breathing O2 sats not available. Discussed options: Conservative treatment, monoclonal antibodies or oral medications.  Pro cons discussed Elected paxlovid, Rx sent. Also call if not better, ER if worse. She verbalized understanding

## 2020-12-03 NOTE — Telephone Encounter (Signed)
Patient states she has covid 19, she tested + yesterday

## 2020-12-04 ENCOUNTER — Other Ambulatory Visit (HOSPITAL_BASED_OUTPATIENT_CLINIC_OR_DEPARTMENT_OTHER): Payer: Self-pay

## 2021-01-06 ENCOUNTER — Ambulatory Visit (INDEPENDENT_AMBULATORY_CARE_PROVIDER_SITE_OTHER): Payer: Medicare HMO | Admitting: Internal Medicine

## 2021-01-06 ENCOUNTER — Other Ambulatory Visit: Payer: Self-pay

## 2021-01-06 VITALS — BP 111/68 | HR 74 | Temp 97.1°F | Ht 64.0 in | Wt 147.0 lb

## 2021-01-06 DIAGNOSIS — R058 Other specified cough: Secondary | ICD-10-CM

## 2021-01-06 DIAGNOSIS — E039 Hypothyroidism, unspecified: Secondary | ICD-10-CM | POA: Diagnosis not present

## 2021-01-06 MED ORDER — LEVOTHYROXINE SODIUM 50 MCG PO TABS: ORAL_TABLET | ORAL | Status: DC

## 2021-01-06 MED ORDER — LEVOTHYROXINE SODIUM 75 MCG PO TABS: ORAL_TABLET | ORAL | Status: DC

## 2021-01-06 NOTE — Patient Instructions (Addendum)
Continue oral Robitussin-DM as needed, let us know if you do not feel back to  normal in 3 to 4 weeks   GO TO THE LAB : Get the blood work     [place], [place] back for a physical exam by 09/2021

## 2021-01-06 NOTE — Progress Notes (Signed)
Subjective:    Patient ID: Shannon Mendez, female    DOB: [birth year], 70 y.o.   MRN: [id number]  DOS:  01/06/2021 Type of visit - description: Acute  Patient was diagnosed with COVID approximately 5 weeks ago. Overall she is much better except for a persistent cough. The cough is worse at night, had some wheezing. She also complains of fatigue, mostly afternoons. She is still able to take a walk, "I have to push myself".  No DOE.  Overall symptoms have decreased in the last few days and now she has no wheezing.   Review of Systems No fever chills No postnasal dripping No chest pain No sputum production  Past Medical History:  Diagnosis Date  . Fibrous papule of nose 2016   Benign  . Goiter 08/15/2014  . History of chicken pox   . Hypothyroid   . Migraine   . Rheumatic fever    as a child  . Vitreous detachment    2011    Past Surgical History:  Procedure Laterality Date  . TONSILLECTOMY AND ADENOIDECTOMY      Allergies as of 01/06/2021      Reactions   Penicillins Hives   Amoxicillin Hives      Medication List       Accurate as of January 06, 2021 11:59 PM. If you have any questions, ask your nurse or doctor.        levothyroxine 50 MCG tablet Commonly known as: SYNTHROID 50 mcg monday-wednesday-Friday What changed: additional instructions Changed by: Kathlene November, MD   levothyroxine 75 MCG tablet Commonly known as: SYNTHROID Take 70mcg by mouth every Tuesday, Thursday, Saturday and Sunday What changed: See the new instructions. Changed by: Kathlene November, MD   SUMAtriptan 50 MG tablet Commonly known as: IMITREX Take 1 tablet (50 mg total) by mouth every 2 (two) hours as needed for migraine. No more than 2 pills in a 24 hour period          Objective:   Physical Exam BP 111/68 (BP Location: Right Arm, Patient Position: Sitting, Cuff Size: Large)   Pulse 74   Temp (!) 97.1 F (36.2 C) (Temporal)   Ht 5\' 4"  (1.626 m)   Wt 147 lb (66.7 kg)   SpO2 96%    BMI 25.23 kg/m  General:   Well developed, NAD, BMI noted. HEENT:  Normocephalic . Face symmetric, atraumatic Lungs:  CTA B Normal respiratory effort, no intercostal retractions, no accessory muscle use. Heart: RRR,  no murmur.  Lower extremities: no pretibial edema bilaterally  Skin: Not pale. Not jaundice Neurologic:  alert & oriented X3.  Speech normal, gait appropriate for age and unassisted Psych--  Cognition and judgment appear intact.  Cooperative with normal attention span and concentration.  Behavior appropriate. No anxious or depressed appearing.      Assessment    ASSESSMENT Hypothyroidism Goiter: s/p USs in [place], last ~ 2013, s/p bx ~ 2011 (-); [place] 09-2019 stable. Migraines, dx age 13s, ne recent XRs as off 11-2019 Vitreous detachment 2011 Rheumatic fever as a child COVID infection  11/2020  PLAN: COVID-19, postviral cough: The patient was diagnosed with COVID-19 few weeks ago, finished paxlovid, she has residual cough and some fatigue as described above. Otherwise she is doing well with no fever or chills.  VSS, she looks well. Anticipate she will be back to normal in few weeks, recommend for now to continue taking Robitussin-DM. If she continues with symptoms in the next 3 to 4  weeks, she will let me know.  Chest x-ray?. Hypothyroidism: Good compliance with Synthroid, 50 mcg Monday Wednesday and Friday, 75 mcg the rest of the days.  Check TSH Preventive care: Recommend COVID-vaccine #4 in approximately 4 months. RTC CPX 09/2021   This visit occurred during the SARS-CoV-2 public health emergency.  Safety protocols were in place, including screening questions prior to the visit, additional usage of staff PPE, and extensive cleaning of exam room while observing appropriate contact time as indicated for disinfecting solutions.

## 2021-01-07 LAB — TSH: TSH: 1.51 u[IU]/mL (ref 0.35–4.50)

## 2021-01-07 NOTE — Assessment & Plan Note (Signed)
COVID-19, postviral cough: The patient was diagnosed with COVID-19 few weeks ago, finished paxlovid, she has residual cough and some fatigue as described above. Otherwise she is doing well with no fever or chills.  VSS, she looks well. Anticipate she will be back to normal in few weeks, recommend for now to continue taking Robitussin-DM. If she continues with symptoms in the next 3 to 4 weeks, she will let me know.  Chest x-ray?. Hypothyroidism: Good compliance with Synthroid, 50 mcg Monday Wednesday and Friday, 75 mcg the rest of the days.  Check TSH Preventive care: Recommend COVID-vaccine #4 in approximately 4 months. RTC CPX 09/2021

## 2021-01-12 ENCOUNTER — Ambulatory Visit: Payer: Medicare HMO | Admitting: Internal Medicine

## 2021-02-17 ENCOUNTER — Other Ambulatory Visit: Payer: Self-pay | Admitting: Internal Medicine

## 2021-02-17 ENCOUNTER — Telehealth: Payer: Self-pay

## 2021-02-17 MED ORDER — LEVOTHYROXINE SODIUM 50 MCG PO TABS: ORAL_TABLET | ORAL | 1 refills | Status: DC

## 2021-02-17 NOTE — Telephone Encounter (Signed)
ERROR

## 2021-03-19 ENCOUNTER — Telehealth: Payer: Self-pay

## 2021-03-19 NOTE — Telephone Encounter (Signed)
Patient called concerned of issue she has discussed  in past with pcp. She states she has been told she has a non cancerous fatty lump or Lypoma on back/neck, it has recently increased in size. Patient also has a spot on back that's dark and changed in size and shape. She has tried to see dermatology but she is unable to get in with dermatology until Janurary. Would like to be seen sooner or taken care of here?   Patient would like call back.

## 2021-03-19 NOTE — Telephone Encounter (Signed)
Rec a OV here to check the area and see if is something urgent

## 2021-03-19 NOTE — Telephone Encounter (Signed)
Spoke w/ Pt- appt scheduled 03/23/21.

## 2021-03-23 ENCOUNTER — Encounter: Payer: Self-pay | Admitting: Internal Medicine

## 2021-03-23 ENCOUNTER — Other Ambulatory Visit: Payer: Self-pay

## 2021-03-23 ENCOUNTER — Ambulatory Visit (INDEPENDENT_AMBULATORY_CARE_PROVIDER_SITE_OTHER): Payer: Medicare HMO | Admitting: Internal Medicine

## 2021-03-23 VITALS — BP 116/60 | HR 60 | Temp 98.2°F | Resp 16 | Ht 64.0 in | Wt 143.1 lb

## 2021-03-23 DIAGNOSIS — L821 Other seborrheic keratosis: Secondary | ICD-10-CM

## 2021-03-23 DIAGNOSIS — D1779 Benign lipomatous neoplasm of other sites: Secondary | ICD-10-CM

## 2021-03-23 NOTE — Patient Instructions (Addendum)
Recommend to proceed with the following vaccines at your pharmacy:  Covid #4 Shingrix (shingles) Flu this fall  See your dermatologist at your plan  We are referring you to a general surgeon to take care of all the fatty tissue (lipoma)

## 2021-03-23 NOTE — Assessment & Plan Note (Signed)
Lipoma: Has been there for years, patient thinks has increased in size lately.  Given location and size, will refer to general surgery Skin lesion: She has multiple skin lesions on her back, the one she is concerned about seems to be a SK.  Recommend to see dermatology in a nonurgent basis.

## 2021-03-23 NOTE — Progress Notes (Signed)
   Subjective:    Patient ID: Shannon Mendez, female    DOB: July 30, 1951, 70 y.o.   MRN: [id number]  DOS:  03/23/2021 Type of visit - description: Acute, concerned about a lipoma Has a lipoma on the left upper back for years, states it feels bigger to her (noted few days ago). The increased size is mild. She denies fever chills, no discharge from the area. The area has not been red/warm/tender.  She also has dark skin lesion nearby  Review of Systems See above   Past Medical History:  Diagnosis Date   Fibrous papule of nose 2016   Benign   Goiter 08/15/2014   History of chicken pox    Hypothyroid    Migraine    Rheumatic fever    as a child   Vitreous detachment    2011    Past Surgical History:  Procedure Laterality Date   TONSILLECTOMY AND ADENOIDECTOMY      Allergies as of 03/23/2021       Reactions   Penicillins Hives   Amoxicillin Hives        Medication List        Accurate as of March 23, 2021 10:00 AM. If you have any questions, ask your nurse or doctor.          levothyroxine 75 MCG tablet Commonly known as: SYNTHROID TAKE 75MCG DAILY EXCEPT ON TUESDAYS AND FRIDAYS TAKE 50MCGS DAILY   levothyroxine 50 MCG tablet Commonly known as: SYNTHROID 50 mcg monday-wednesday-Friday   SUMAtriptan 50 MG tablet Commonly known as: IMITREX Take 1 tablet (50 mg total) by mouth every 2 (two) hours as needed for migraine. No more than 2 pills in a 24 hour period           Objective:   Physical Exam Neck:    BP 116/60 (BP Location: Left Arm, Patient Position: Sitting, Cuff Size: Small)   Pulse 60   Temp 98.2 F (36.8 C) (Oral)   Resp 16   Ht '5\' 4"'$  (1.626 m)   Wt 143 lb 2 oz (64.9 kg)   SpO2 98%   BMI 24.57 kg/m  General:   Well developed, NAD, BMI noted. HEENT:  Normocephalic . Face symmetric, atraumatic Lower extremities: no pretibial edema bilaterally  Skin: Not pale. Not jaundice Neurologic:  alert & oriented X3.  Speech normal, gait  appropriate for age and unassisted Psych--  Cognition and judgment appear intact.  Cooperative with normal attention span and concentration.  Behavior appropriate. No anxious or depressed appearing.      Assessment     ASSESSMENT Hypothyroidism Goiter: s/p USs in [place], last ~ 2013, s/p bx ~ 2011 (-); [place] 09-2019 stable. Migraines, dx age 63s, ne recent XRs as off 11-2019 Vitreous detachment 2011 Rheumatic fever as a child COVID infection  11/2020  PLAN: Lipoma: Has been there for years, patient thinks has increased in size lately.  Given location and size, will refer to general surgery Skin lesion: She has multiple skin lesions on her back, the one she is concerned about seems to be a SK.  Recommend to see dermatology in a nonurgent basis.   This visit occurred during the SARS-CoV-2 public health emergency.  Safety protocols were in place, including screening questions prior to the visit, additional usage of staff PPE, and extensive cleaning of exam room while observing appropriate contact time as indicated for disinfecting solutions.

## 2021-03-30 ENCOUNTER — Encounter: Payer: Self-pay | Admitting: Internal Medicine

## 2021-04-08 ENCOUNTER — Ambulatory Visit (INDEPENDENT_AMBULATORY_CARE_PROVIDER_SITE_OTHER): Payer: Medicare HMO

## 2021-04-08 VITALS — Ht 64.0 in | Wt 143.0 lb

## 2021-04-08 DIAGNOSIS — Z0000 Encounter for general adult medical examination without abnormal findings: Secondary | ICD-10-CM | POA: Diagnosis not present

## 2021-04-08 NOTE — Patient Instructions (Signed)
Shannon Mendez , Thank you for taking time to complete your Medicare Wellness Visit. I appreciate your ongoing commitment to your health goals. Please review the following plan we discussed and let me know if I can assist you in the future.   Screening recommendations/referrals: Colonoscopy: Completed FOBT 11/09/2020-Due 11/09/2021 Mammogram: Completed 10/21/2020-Due 10/21/2021 Bone Density: Due-Declined today Recommended yearly ophthalmology/optometry visit for glaucoma screening and checkup Recommended yearly dental visit for hygiene and checkup  Vaccinations: Influenza vaccine: Due-May obtain vaccine at our office or your local pharmacy Pneumococcal vaccine: Up to date Tdap vaccine: Up to date-Due-01/19/2025 Shingles vaccine: Discuss with pharmacy Covid-19:Booster due  Advanced directives: Information mailed today  Conditions/risks identified: See problem list  Next appointment: Follow up in one year for your annual wellness visit    Preventive Care 65 Years and Older, Female Preventive care refers to lifestyle choices and visits with your health care provider that can promote health and wellness. What does preventive care include? A yearly physical exam. This is also called an annual well check. Dental exams once or twice a year. Routine eye exams. Ask your health care provider how often you should have your eyes checked. Personal lifestyle choices, including: Daily care of your teeth and gums. Regular physical activity. Eating a healthy diet. Avoiding tobacco and drug use. Limiting alcohol use. Practicing safe sex. Taking low-dose aspirin every day. Taking vitamin and mineral supplements as recommended by your health care provider. What happens during an annual well check? The services and screenings done by your health care provider during your annual well check will depend on your age, overall health, lifestyle risk factors, and family history of disease. Counseling  Your health  care provider may ask you questions about your: Alcohol use. Tobacco use. Drug use. Emotional well-being. Home and relationship well-being. Sexual activity. Eating habits. History of falls. Memory and ability to understand (cognition). Work and work Statistician. Reproductive health. Screening  You may have the following tests or measurements: Height, weight, and BMI. Blood pressure. Lipid and cholesterol levels. These may be checked every 5 years, or more frequently if you are over 45 years old. Skin check. Lung cancer screening. You may have this screening every year starting at age 21 if you have a 30-pack-year history of smoking and currently smoke or have quit within the past 15 years. Fecal occult blood test (FOBT) of the stool. You may have this test every year starting at age 68. Flexible sigmoidoscopy or colonoscopy. You may have a sigmoidoscopy every 5 years or a colonoscopy every 10 years starting at age 79. Hepatitis C blood test. Hepatitis B blood test. Sexually transmitted disease (STD) testing. Diabetes screening. This is done by checking your blood sugar (glucose) after you have not eaten for a while (fasting). You may have this done every 1-3 years. Bone density scan. This is done to screen for osteoporosis. You may have this done starting at age 30. Mammogram. This may be done every 1-2 years. Talk to your health care provider about how often you should have regular mammograms. Talk with your health care provider about your test results, treatment options, and if necessary, the need for more tests. Vaccines  Your health care provider may recommend certain vaccines, such as: Influenza vaccine. This is recommended every year. Tetanus, diphtheria, and acellular pertussis (Tdap, Td) vaccine. You may need a Td booster every 10 years. Zoster vaccine. You may need this after age 11. Pneumococcal 13-valent conjugate (PCV13) vaccine. One dose is recommended after age  65. Pneumococcal polysaccharide (PPSV23) vaccine. One dose is recommended after age 107. Talk to your health care provider about which screenings and vaccines you need and how often you need them. This information is not intended to replace advice given to you by your health care provider. Make sure you discuss any questions you have with your health care provider. Document Released: 08/21/2015 Document Revised: 04/13/2016 Document Reviewed: 05/26/2015 Elsevier Interactive Patient Education  2017 [place] Prevention in the Home Falls can cause injuries. They can happen to people of all ages. There are many things you can do to make your home safe and to help prevent falls. What can I do on the outside of my home? Regularly fix the edges of walkways and driveways and fix any cracks. Remove anything that might make you trip as you walk through a door, such as a raised step or threshold. Trim any bushes or trees on the path to your home. Use bright outdoor lighting. Clear any walking paths of anything that might make someone trip, such as rocks or tools. Regularly check to see if handrails are loose or broken. Make sure that both sides of any steps have handrails. Any raised decks and porches should have guardrails on the edges. Have any leaves, snow, or ice cleared regularly. Use sand or salt on walking paths during winter. Clean up any spills in your garage right away. This includes oil or grease spills. What can I do in the bathroom? Use night lights. Install grab bars by the toilet and in the tub and shower. Do not use towel bars as grab bars. Use non-skid mats or decals in the tub or shower. If you need to sit down in the shower, use a plastic, non-slip stool. Keep the floor dry. Clean up any water that spills on the floor as soon as it happens. Remove soap buildup in the tub or shower regularly. Attach bath mats securely with double-sided non-slip rug tape. Do not have throw  rugs and other things on the floor that can make you trip. What can I do in the bedroom? Use night lights. Make sure that you have a light by your bed that is easy to reach. Do not use any sheets or blankets that are too big for your bed. They should not hang down onto the floor. Have a firm chair that has side arms. You can use this for support while you get dressed. Do not have throw rugs and other things on the floor that can make you trip. What can I do in the kitchen? Clean up any spills right away. Avoid walking on wet floors. Keep items that you use a lot in easy-to-reach places. If you need to reach something above you, use a strong step stool that has a grab bar. Keep electrical cords out of the way. Do not use floor polish or wax that makes floors slippery. If you must use wax, use non-skid floor wax. Do not have throw rugs and other things on the floor that can make you trip. What can I do with my stairs? Do not leave any items on the stairs. Make sure that there are handrails on both sides of the stairs and use them. Fix handrails that are broken or loose. Make sure that handrails are as long as the stairways. Check any carpeting to make sure that it is firmly attached to the stairs. Fix any carpet that is loose or worn. Avoid having throw rugs at the  top or bottom of the stairs. If you do have throw rugs, attach them to the floor with carpet tape. Make sure that you have a light switch at the top of the stairs and the bottom of the stairs. If you do not have them, ask someone to add them for you. What else can I do to help prevent falls? Wear shoes that: Do not have high heels. Have rubber bottoms. Are comfortable and fit you well. Are closed at the toe. Do not wear sandals. If you use a stepladder: Make sure that it is fully opened. Do not climb a closed stepladder. Make sure that both sides of the stepladder are locked into place. Ask someone to hold it for you, if  possible. Clearly mark and make sure that you can see: Any grab bars or handrails. First and last steps. Where the edge of each step is. Use tools that help you move around (mobility aids) if they are needed. These include: Canes. Walkers. Scooters. Crutches. Turn on the lights when you go into a dark area. Replace any light bulbs as soon as they burn out. Set up your furniture so you have a clear path. Avoid moving your furniture around. If any of your floors are uneven, fix them. If there are any pets around you, be aware of where they are. Review your medicines with your doctor. Some medicines can make you feel dizzy. This can increase your chance of falling. Ask your doctor what other things that you can do to help prevent falls. This information is not intended to replace advice given to you by your health care provider. Make sure you discuss any questions you have with your health care provider. Document Released: 05/21/2009 Document Revised: 12/31/2015 Document Reviewed: 08/29/2014 Elsevier Interactive Patient Education  2017 [employer].

## 2021-04-08 NOTE — Progress Notes (Signed)
Subjective:   Geisha Bogenschutz is a 70 y.o. female who presents for an Initial Medicare Annual Wellness Visit.  I connected with T[place]a today by telephone and verified that I am speaking with the correct person using two identifiers. Location patient: home Location provider: work Persons participating in the virtual visit: patient, Marine scientist.    I discussed the limitations, risks, security and privacy concerns of performing an evaluation and management service by telephone and the availability of in person appointments. I also discussed with the patient that there may be a patient responsible charge related to this service. The patient expressed understanding and verbally consented to this telephonic visit.    Interactive audio and video telecommunications were attempted between this provider and patient, however failed, due to patient having technical difficulties OR patient did not have access to video capability.  We continued and completed visit with audio only.  Some vital signs may be absent or patient reported.   Time Spent with patient on telephone encounter: 20 minutes   Review of Systems     Cardiac Risk Factors include: advanced age (>47mn, >>58women)     Objective:    Today's Vitals   04/08/21 1423 04/08/21 1424  Weight: 143 lb (64.9 kg)   Height: '5\' 4"'$  (1.626 m)   PainSc:  7    Body mass index is 24.55 kg/m.  Advanced Directives 04/08/2021  Does Patient Have a Medical Advance Directive? No  Would patient like information on creating a medical advance directive? Yes (MAU/Ambulatory/Procedural Areas - Information given)    Current Medications (verified) Outpatient Encounter Medications as of 04/08/2021  Medication Sig   levothyroxine (SYNTHROID) 50 MCG tablet 50 mcg monday-wednesday-Friday   levothyroxine (SYNTHROID) 75 MCG tablet TAKE 75MCG DAILY EXCEPT ON TUESDAYS AND FRIDAYS TAKE 50MCGS DAILY   SUMAtriptan (IMITREX) 50 MG tablet Take 1 tablet (50 mg total) by mouth  every 2 (two) hours as needed for migraine. No more than 2 pills in a 24 hour period (Patient not taking: Reported on 04/08/2021)   No facility-administered encounter medications on file as of 04/08/2021.    Allergies (verified) Penicillins and Amoxicillin   History: Past Medical History:  Diagnosis Date   Fibrous papule of nose 2016   Benign   Goiter 08/15/2014   History of chicken pox    Hypothyroid    Migraine    Rheumatic fever    as a child   Vitreous detachment    2011   Past Surgical History:  Procedure Laterality Date   TONSILLECTOMY AND ADENOIDECTOMY     Family History  Problem Relation Age of Onset   Ovarian cancer Mother        in her 738s  Diabetes Other        GM ?   Lung cancer Father    Aneurysm Father        Aorta A   Colon cancer Neg Hx    Cervical cancer Neg Hx    Breast cancer Neg Hx    CAD Neg Hx    Social History   Socioeconomic History   Marital status: Married    Spouse name: Not on file   Number of children: 2   Years of education: Not on file   Highest education level: Not on file  Occupational History   Occupation: [place] from an [employer] [place]  Tobacco Use   Smoking status: Never   Smokeless tobacco: Never  Substance  and Sexual Activity   Alcohol use: Yes    Alcohol/week: 0.0 standard drinks    Comment: Occasional   Drug use: No   Sexual activity: Not on file  Other Topics Concern   Not on file  Social History Narrative   Moved from [place] 2014   Has a son ([place]) and a  daughter (she lives in [place]) they were born in the 25s       Social Determinants of Health   Financial Resource Strain: Low Risk    Difficulty of Paying Living Expenses: Not hard at all  Food Insecurity: No Food Insecurity   Worried About Charity fundraiser in the Last Year: Never true   Arboriculturist in the Last Year: Never true  Transportation Needs: No Transportation Needs   Lack of Transportation (Medical): No    Lack of Transportation (Non-Medical): No  Physical Activity: Insufficiently Active   Days of Exercise per Week: 3 days   Minutes of Exercise per Session: 30 min  Stress: No Stress Concern Present   Feeling of Stress : Not at all  Social Connections: Moderately Isolated   Frequency of Communication with Friends and Family: More than three times a week   Frequency of Social Gatherings with Friends and Family: More than three times a week   Attends Religious Services: Never   Marine scientist or Organizations: No   Attends Music therapist: Never   Marital Status: Married    Tobacco Counseling Counseling given: Not Answered   Clinical Intake:  Pre-visit preparation completed: Yes  Pain : 0-10 Pain Score: 7  (with applied pressure) Pain Type: Acute pain Pain Location: Abdomen Pain Orientation: Right Pain Onset: In the past 7 days Pain Frequency: Intermittent     BMI - recorded: 24.55 Nutritional Status: BMI of 19-24  Normal Nutritional Risks: None Diabetes: No  How often do you need to have someone help you when you read instructions, pamphlets, or other written materials from your doctor or pharmacy?: 1 - Never  Diabetic?No  Interpreter Needed?: No  Information entered by :: Caroleen Hamman LPN   Activities of Daily Living In your present state of health, do you have any difficulty performing the following activities: 04/08/2021 10/01/2020  Hearing? N N  Vision? N N  Difficulty concentrating or making decisions? N N  Walking or climbing stairs? N N  Dressing or bathing? N N  Doing errands, shopping? N N  Preparing Food and eating ? N -  Using the Toilet? N -  In the past six months, have you accidently leaked urine? N -  Do you have problems with loss of bowel control? N -  Managing your Medications? N -  Managing your Finances? N -  Housekeeping or managing your Housekeeping? N -  Some recent data might be hidden    Patient Care  Team: Colon Branch, MD as PCP - General (Internal Medicine) Clark-Burning, Anderson Malta, PA-C (Inactive) as Physician Assistant (Dermatology) Linda Hedges, DO as Consulting Physician (Obstetrics and Gynecology)  Indicate any recent Medical Services you may have received from other than [facility] providers in the past year (date may be approximate).     Assessment:   This is a routine wellness examination for [place].  Hearing/Vision screen Hearing Screening - Comments:: No issues Vision Screening - Comments:: Wears glasses Last eye exam-08/2020-[facility]-Dr. Jerline Pain  Dietary issues and exercise activities discussed: Current Exercise Habits: Home exercise routine, Type of exercise:  walking;calisthenics, Time (Minutes): 30, Frequency (Times/Week): 3, Weekly Exercise (Minutes/Week): 90, Intensity: Mild   Goals Addressed             This Visit's Progress    Patient Stated       Ea healthier, drink more water & increase activity       Depression Screen PHQ 2/9 Scores 04/08/2021 03/23/2021 10/01/2020 09/18/2019 09/07/2018 08/18/2017  PHQ - 2 Score 0 0 0 0 0 0    Fall Risk Fall Risk  04/08/2021 03/23/2021 10/01/2020 09/18/2019 09/07/2018  Falls in the past year? 0 0 0 0 0  Number falls in past yr: 0 0 0 0 -  Injury with Fall? 0 0 0 0 -  Follow up Falls prevention discussed Falls evaluation completed - Falls evaluation completed -    FALL RISK PREVENTION PERTAINING TO THE HOME:  Any stairs in or around the home? Yes  If so, are there any without handrails? No  Home free of loose throw rugs in walkways, pet beds, electrical cords, etc? Yes  Adequate lighting in your home to reduce risk of falls? Yes   ASSISTIVE DEVICES UTILIZED TO PREVENT FALLS:  Life alert? No  Use of a cane, walker or w/c? No  Grab bars in the bathroom? Yes  Shower chair or bench in shower? No  Elevated toilet seat or a handicapped toilet? No   TIMED UP AND GO:  Was the test performed? No . Phone  visit   Cognitive Function:Normal cognitive status assessed by  this Nurse Health Advisor. No abnormalities found.          Immunizations Immunization History  Administered Date(s) Administered   Fluad Quad(high Dose 65+) 05/23/2019, 07/20/2020   PFIZER(Purple Top)SARS-COV-2 Vaccination 10/03/2019, 10/23/2019, 06/15/2020   Pneumococcal Conjugate-13 10/01/2020   Pneumococcal Polysaccharide-23 06/25/2019   Tdap 01/20/2015    TDAP status: Up to date  Flu Vaccine status: Due, Education has been provided regarding the importance of this vaccine. Advised may receive this vaccine at local pharmacy or Health Dept. Aware to provide a copy of the vaccination record if obtained from local pharmacy or Health Dept. Verbalized acceptance and understanding.  Pneumococcal vaccine status: Up to date  Covid-19 vaccine status: Information provided on how to obtain vaccines. Booster due  Qualifies for Shingles Vaccine? Yes   Zostavax completed No   Shingrix Completed?: No.    Education has been provided regarding the importance of this vaccine. Patient has been advised to call insurance company to determine out of pocket expense if they have not yet received this vaccine. Advised may also receive vaccine at local pharmacy or Health Dept. Verbalized acceptance and understanding.  Screening Tests Health Maintenance  Topic Date Due   Zoster Vaccines- Shingrix (1 of 2) Never done   COVID-19 Vaccine (4 - Booster for Pfizer series) 09/15/2020   INFLUENZA VACCINE  03/08/2021   COLONOSCOPY (Pts 45-66yr Insurance coverage will need to be confirmed)  10/01/2021 (Originally 01/24/2018)   MAMMOGRAM  10/21/2021   PAP SMEAR-Modifier  10/22/2022   TETANUS/TDAP  01/19/2025   DEXA SCAN  Completed   Hepatitis C Screening  Completed   PNA vac Low Risk Adult  Completed   HPV VACCINES  Aged Out    Health Maintenance  Health Maintenance Due  Topic Date Due   Zoster Vaccines- Shingrix (1 of 2) Never done    COVID-19 Vaccine (4 - Booster for Pfizer series) 09/15/2020   INFLUENZA VACCINE  03/08/2021    Colorectal cancer screening:  Type of screening: FOBT/FIT. Completed 11/09/2020. Repeat every 1 years  Mammogram status: Completed Bilateral 10/21/2020. Repeat every year  Bone density status: Declined today  Lung Cancer Screening: (Low Dose CT Chest recommended if Age 29-80 years, 30 pack-year currently smoking OR have quit w/in 15years.) does not qualify.     Additional Screening:  Hepatitis C Screening: Completed 08/18/2017  Vision Screening: Recommended annual ophthalmology exams for early detection of glaucoma and other disorders of the eye. Is the patient up to date with their annual eye exam?  Yes  Who is the provider or what is the name of the office in which the patient attends annual eye exams? Dr. Jerline Pain   Dental Screening: Recommended annual dental exams for proper oral hygiene  Community Resource Referral / Chronic Care Management: CRR required this visit?  No   CCM required this visit?  No      Plan:     I have personally reviewed and noted the following in the patient's chart:   Medical and social history Use of alcohol, tobacco or illicit drugs  Current medications and supplements including opioid prescriptions. Patient is not currently taking opioid prescriptions. Functional ability and status Nutritional status Physical activity Advanced directives List of other physicians Hospitalizations, surgeries, and ER visits in previous 12 months Vitals Screenings to include cognitive, depression, and falls Referrals and appointments  In addition, I have reviewed and discussed with patient certain preventive protocols, quality metrics, and best practice recommendations. A written personalized care plan for preventive services as well as general preventive health recommendations were provided to patient.   Patient to access avs on mychart.  Marta Antu,  LPN   [id number]   Nurse Notes: None

## 2021-04-15 DIAGNOSIS — D171 Benign lipomatous neoplasm of skin and subcutaneous tissue of trunk: Secondary | ICD-10-CM | POA: Diagnosis not present

## 2021-05-07 ENCOUNTER — Telehealth: Payer: Self-pay | Admitting: Internal Medicine

## 2021-05-07 NOTE — Telephone Encounter (Signed)
Patient tested positive for covid late April beginning of March and wants to know if now is a great time to get the covid booster. Please advise.

## 2021-05-10 NOTE — Telephone Encounter (Signed)
LMOM informing Pt okay to proceed with covid vaccine booster now.

## 2021-05-24 ENCOUNTER — Telehealth: Payer: Self-pay | Admitting: Internal Medicine

## 2021-05-24 MED ORDER — LEVOTHYROXINE SODIUM 50 MCG PO TABS: ORAL_TABLET | ORAL | 1 refills | Status: DC

## 2021-05-24 MED ORDER — LEVOTHYROXINE SODIUM 75 MCG PO TABS: ORAL_TABLET | ORAL | 1 refills | Status: DC

## 2021-05-24 NOTE — Telephone Encounter (Signed)
Rx's fixed [facility] to [facility].

## 2021-05-24 NOTE — Telephone Encounter (Signed)
Pt stated that her levothyroxine (SYNTHROID) 75 MCG tablet days are wrong. She needs a refill to be sent to [facility] pharmacy that states she takes the 75mg s on Tuesdays, Thursdays, Saturdays, and Sundays. Please advise.    [id number] [address], [address]  [place], [place] [address]

## 2021-06-01 ENCOUNTER — Ambulatory Visit: Payer: Medicare HMO | Attending: Internal Medicine

## 2021-06-01 DIAGNOSIS — Z23 Encounter for immunization: Secondary | ICD-10-CM

## 2021-06-01 NOTE — Progress Notes (Signed)
   Covid-19 Vaccination Clinic  Name:  Shannon Mendez    MRN: [id number] DOB: [birth year]  06/01/2021  [name] was observed post Covid-19 immunization for 15 minutes without incident. She was provided with Vaccine Information Sheet and instruction to access the V-Safe system.   [name] was instructed to call 911 with any severe reactions post vaccine: Difficulty breathing  Swelling of face and throat  A fast heartbeat  A bad rash all over body  Dizziness and weakness   Immunizations Administered     Name Date Dose VIS Date Route   Pfizer Covid-19 Vaccine Bivalent Booster 06/01/2021 11:28 AM 0.3 mL 04/07/2021 Intramuscular   Manufacturer: [employer]   Lot: [id number]   [place]: [phone]

## 2021-06-25 ENCOUNTER — Other Ambulatory Visit (HOSPITAL_BASED_OUTPATIENT_CLINIC_OR_DEPARTMENT_OTHER): Payer: Self-pay

## 2021-06-25 MED ORDER — PFIZER COVID-19 VAC BIVALENT 30 MCG/0.3ML IM SUSP
INTRAMUSCULAR | 0 refills | Status: DC
  Filled 2021-06-25: qty 0.3, 1d supply, fill #0

## 2021-08-13 ENCOUNTER — Encounter: Payer: Self-pay | Admitting: Internal Medicine

## 2021-08-13 ENCOUNTER — Ambulatory Visit (INDEPENDENT_AMBULATORY_CARE_PROVIDER_SITE_OTHER): Payer: Medicare HMO | Admitting: Internal Medicine

## 2021-08-13 VITALS — BP 116/64 | HR 68 | Temp 98.3°F | Resp 16 | Ht 64.0 in | Wt 145.2 lb

## 2021-08-13 DIAGNOSIS — R42 Dizziness and giddiness: Secondary | ICD-10-CM | POA: Diagnosis not present

## 2021-08-13 MED ORDER — MECLIZINE HCL 25 MG PO TABS: 25.0000 mg | ORAL_TABLET | Freq: Every evening | ORAL | 0 refills | Status: DC | PRN

## 2021-08-13 NOTE — Progress Notes (Signed)
Subjective:    Patient ID: Shannon Mendez, female    DOB: 1951-06-15, 71 y.o.   MRN: [id number]  DOS:  08/13/2021 Type of visit - description: Acute  Symptoms a started 2 to 3 weeks ago: When she turns in bed she gets consistently dizzy described as a spinning for several seconds. Also when she first get out of bed in the morning she feels dizzy for short time. As the day goes by, symptoms disappear.  No fever chills No runny nose or sore throat.  No otalgia Not taking any new medications No nausea or vomiting. Keeping good hydration. No headache, diplopia, slurred speech or motor deficits  Review of Systems See above   Past Medical History:  Diagnosis Date   Fibrous papule of nose 2016   Benign   Goiter 08/15/2014   History of chicken pox    Hypothyroid    Migraine    Rheumatic fever    as a child   Vitreous detachment    2011    Past Surgical History:  Procedure Laterality Date   TONSILLECTOMY AND ADENOIDECTOMY      Allergies as of 08/13/2021       Reactions   Penicillins Hives   Amoxicillin Hives        Medication List        Accurate as of August 13, 2021 11:59 PM. If you have any questions, ask your nurse or doctor.          STOP taking these medications    Pfizer COVID-19 Vac Bivalent injection Generic drug: COVID-19 mRNA bivalent vaccine Therapist, music) Stopped by: Kathlene November, MD       TAKE these medications    levothyroxine 75 MCG tablet Commonly known as: SYNTHROID TAKE 75MCG ON T, TH, SAT, AND SUNDAY   levothyroxine 50 MCG tablet Commonly known as: SYNTHROID TAKE 50 mcg M,W,F   meclizine 25 MG tablet Commonly known as: ANTIVERT Take 1 tablet (25 mg total) by mouth at bedtime as needed for dizziness. Started by: Kathlene November, MD   SUMAtriptan 50 MG tablet Commonly known as: IMITREX Take 1 tablet (50 mg total) by mouth every 2 (two) hours as needed for migraine. No more than 2 pills in a 24 hour period           Objective:    Physical Exam BP 116/64 (BP Location: Left Arm, Patient Position: Sitting, Cuff Size: Small)    Pulse 68    Temp 98.3 F (36.8 C) (Oral)    Resp 16    Ht 5\' 4"  (1.626 m)    Wt 145 lb 4 oz (65.9 kg)    SpO2 96%    BMI 24.93 kg/m  General:   Well developed, NAD, BMI noted. HEENT:  Normocephalic . Face symmetric, atraumatic Neck: Normal carotid arteries Lungs:  CTA B Normal respiratory effort, no intercostal retractions, no accessory muscle use. Heart: RRR,  no murmur.  Lower extremities: no pretibial edema bilaterally  Skin: Not pale. Not jaundice Neurologic:  alert & oriented X3.  Speech normal, gait appropriate for age and unassisted. EOMI, motor and DTR symmetric Psych--  Cognition and judgment appear intact.  Cooperative with normal attention span and concentration.  Behavior appropriate. No anxious or depressed appearing.      Assessment      ASSESSMENT Hypothyroidism Goiter: s/p USs in [place], last ~ 2013, s/p bx ~ 2011 (-); [place] 09-2019 stable. Migraines, dx age 70s, ne recent XRs as off 11-2019 Vitreous detachment 2011  Rheumatic fever as a child COVID infection  11/2020  PLAN: Vertigo: Benign vertigo, sxs  likely  peripheral , orthostatic VS today (-) . I explained pt what vertigo is,  my assumption is that this will self resolve within 2 to 3 weeks. In the meantime recommend good hydration, Antivert at night, call if not improving in the next 2 weeks, rehab referral?. RTC scheduled for a physical in few weeks.   This visit occurred during the SARS-CoV-2 public health emergency.  Safety protocols were in place, including screening questions prior to the visit, additional usage of staff PPE, and extensive cleaning of exam room while observing appropriate contact time as indicated for disinfecting solutions.

## 2021-08-13 NOTE — Patient Instructions (Addendum)
Drink plenty fluids  Take Antivert (meclizine) at bedtime if needed, I sent a prescription  If you are not gradually better in the next couple of weeks let me know    Vertigo Vertigo is the feeling that you or the things around you are moving when they are not. This feeling can come and go at any time. Vertigo often goes away on its own. This condition can be dangerous if it happens when you are doing activities like driving or working with machines. Your doctor will do tests to find the cause of your vertigo. These tests will also help your doctor decide on the best treatment for you. Follow these instructions at home: Eating and drinking   Drink enough fluid to keep your pee (urine) pale yellow. Do not drink alcohol. Activity Return to your normal activities when your doctor says that it is safe. In the morning, first sit up on the side of the bed. When you feel okay, stand slowly while you hold onto something until you know that your balance is fine. Move slowly. Avoid sudden body or head movements or certain positions, as told by your doctor. Use a cane if you have trouble standing or walking. Sit down right away if you feel dizzy. Avoid doing any tasks or activities that can cause danger to you or others if you get dizzy. Avoid bending down if you feel dizzy. Place items in your home so that they are easy for you to reach without bending or leaning over. Do not drive or use machinery if you feel dizzy. General instructions Take over-the-counter and prescription medicines only as told by your doctor. Keep all follow-up visits. Contact a doctor if: Your medicine does not help your vertigo. Your problems get worse or you have new symptoms. You have a fever. You feel like you may vomit (nauseous), or this feeling gets worse. You start to vomit. Your family or friends see changes in how you act. You lose feeling (have numbness) in part of your body. You feel prickling and tingling  in a part of your body. Get help right away if: You are always dizzy. You faint. You get very bad headaches. You get a stiff neck. Bright light starts to bother you. You have trouble moving or talking. You feel weak in your hands, arms, or legs. You have changes in your hearing or in how you see (vision). These symptoms may be an emergency. Get help right away. Call your local emergency services (911 in the [place] Do not wait to see if the symptoms will go away. Do not drive yourself to the hospital. Summary Vertigo is the feeling that you or the things around you are moving when they are not. Your doctor will do tests to find the cause of your vertigo. You may be told to avoid some tasks, positions, or movements. Contact a doctor if your medicine is not helping, or if you have a fever, new symptoms, or a change in how you act. Get help right away if you get very bad headaches, or if you have changes in how you speak, hear, or see. This information is not intended to replace advice given to you by your health care provider. Make sure you discuss any questions you have with your health care provider. Document Revised: 06/24/2020 Document Reviewed: 06/24/2020 Elsevier Patient Education  2022 [employer].

## 2021-08-15 NOTE — Assessment & Plan Note (Signed)
Vertigo: Benign vertigo, sxs  likely  peripheral , orthostatic VS today (-) . I explained pt what vertigo is,  my assumption is that this will self resolve within 2 to 3 weeks. In the meantime recommend good hydration, Antivert at night, call if not improving in the next 2 weeks, rehab referral?. RTC scheduled for a physical in few weeks.

## 2021-08-16 ENCOUNTER — Ambulatory Visit: Payer: Medicare HMO | Admitting: Internal Medicine

## 2021-08-17 ENCOUNTER — Ambulatory Visit: Payer: Medicare HMO | Admitting: Physician Assistant

## 2021-10-04 ENCOUNTER — Encounter: Payer: Self-pay | Admitting: Internal Medicine

## 2021-10-04 ENCOUNTER — Ambulatory Visit (INDEPENDENT_AMBULATORY_CARE_PROVIDER_SITE_OTHER): Payer: Medicare HMO | Admitting: Internal Medicine

## 2021-10-04 VITALS — BP 122/68 | HR 65 | Temp 98.1°F | Resp 16 | Ht 64.0 in | Wt 146.4 lb

## 2021-10-04 DIAGNOSIS — Z0000 Encounter for general adult medical examination without abnormal findings: Secondary | ICD-10-CM

## 2021-10-04 DIAGNOSIS — Z0001 Encounter for general adult medical examination with abnormal findings: Secondary | ICD-10-CM | POA: Diagnosis not present

## 2021-10-04 DIAGNOSIS — Z780 Asymptomatic menopausal state: Secondary | ICD-10-CM

## 2021-10-04 DIAGNOSIS — Z1211 Encounter for screening for malignant neoplasm of colon: Secondary | ICD-10-CM

## 2021-10-04 DIAGNOSIS — R42 Dizziness and giddiness: Secondary | ICD-10-CM

## 2021-10-04 DIAGNOSIS — E039 Hypothyroidism, unspecified: Secondary | ICD-10-CM

## 2021-10-04 NOTE — Progress Notes (Signed)
Subjective:    Patient ID: Shannon Mendez, female    DOB: [birth year], 71 y.o.   MRN: [id number]  DOS:  10/04/2021 Type of visit - description: cpx  Here for CPX. At the last visit she c/o  vertigo. Symptoms are still there, triggered by turning in bed, standing up fast, bending down.   Symptoms are short-lived, no associated nausea vomiting. In addition she reports no slurred speech, diplopia, headache, tinnitus or difficulty hearing  Review of Systems  Other than above, a 14 point review of systems is negative     Past Medical History:  Diagnosis Date   Fibrous papule of nose 2016   Benign   Goiter 08/15/2014   History of chicken pox    Hypothyroid    Migraine    Rheumatic fever    as a child   Vitreous detachment    2011    Past Surgical History:  Procedure Laterality Date   TONSILLECTOMY AND ADENOIDECTOMY     Social History   Socioeconomic History   Marital status: Married    Spouse name: Not on file   Number of children: 2   Years of education: Not on file   Highest education level: Not on file  Occupational History   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  Tobacco Use   Smoking status: Never   Smokeless tobacco: Never  Substance and Sexual Activity   Alcohol use: Yes    Alcohol/week: 0.0 standard drinks    Comment: Occasional   Drug use: No   Sexual activity: Not on file  Other Topics Concern   Not on file  Social History Narrative   Moved from [place] 2014   Has a son ([place]) and a  daughter (she lives in [place]) they were born in the 53s       Social Determinants of Health   Financial Resource Strain: Low Risk    Difficulty of Paying Living Expenses: Not hard at all  Food Insecurity: No Food Insecurity   Worried About Charity fundraiser in the Last Year: Never true   Arboriculturist in the Last Year: Never true  Transportation Needs: No Transportation Needs   Lack of Transportation (Medical): No   Lack  of Transportation (Non-Medical): No  Physical Activity: Insufficiently Active   Days of Exercise per Week: 3 days   Minutes of Exercise per Session: 30 min  Stress: No Stress Concern Present   Feeling of Stress : Not at all  Social Connections: Moderately Isolated   Frequency of Communication with Friends and Family: More than three times a week   Frequency of Social Gatherings with Friends and Family: More than three times a week   Attends Religious Services: Never   Marine scientist or Organizations: No   Attends Music therapist: Never   Marital Status: Married  Human resources officer Violence: Not At Risk   Fear of Current or Ex-Partner: No   Emotionally Abused: No   Physically Abused: No   Sexually Abused: No    Current Outpatient Medications  Medication Instructions   levothyroxine (SYNTHROID) 50 MCG tablet TAKE 50 mcg M,W,F   levothyroxine (SYNTHROID) 75 MCG tablet TAKE 75MCG ON T, TH, SAT, AND SUNDAY   meclizine (ANTIVERT) 25 mg, Oral, At bedtime PRN   SUMAtriptan (IMITREX) 50 mg, Oral, Every 2 hours PRN, No more than 2 pills in a 24 hour period  Objective:   Physical Exam BP 122/68 (BP Location: Left Arm, Patient Position: Sitting, Cuff Size: Small)    Pulse 65    Temp 98.1 F (36.7 C) (Oral)    Resp 16    Ht 5\' 4"  (1.626 m)    Wt 146 lb 6 oz (66.4 kg)    SpO2 97%    BMI 25.13 kg/m  General: Well developed, NAD, BMI noted Neck: No  thyromegaly  HEENT:  Normocephalic . Face symmetric, atraumatic Lungs:  CTA B Normal respiratory effort, no intercostal retractions, no accessory muscle use. Heart: RRR,  no murmur.  Abdomen:  Not distended, soft, non-tender. No rebound or rigidity.   Lower extremities: no pretibial edema bilaterally  Skin: Exposed areas without rash. Not pale. Not jaundice Neurologic:  alert & oriented X3. EOMI, pupils equal reactive Speech normal, gait appropriate for age and unassisted Strength symmetric and appropriate for  age. DTR symmetric Psych: Cognition and judgment appear intact.  Cooperative with normal attention span and concentration.  Behavior appropriate. No anxious or depressed appearing.     Assessment    ASSESSMENT Hypothyroidism Goiter: s/p USs in [place], last ~ 2013, s/p bx ~ 2011 (-); [place] 09-2019 stable. Migraines, dx age 44s, ne recent XRs as off 11-2019 Vitreous detachment 2011 Rheumatic fever as a child COVID infection  11/2020  PLAN: Here for CPX Hypothyroidism: Check TSH Migraines: Not an issue at this time Vertigo: Ongoing since mid December 2022, neurological exam remains normal.  Due to persistent symptoms recommend a brain MRI and referred to physical therapy for vestibular rehabilitation. RTC 4 months  In addition to CPX I assess chronic med problems and persistent vertigo   This visit occurred during the SARS-CoV-2 public health emergency.  Safety protocols were in place, including screening questions prior to the visit, additional usage of staff PPE, and extensive cleaning of exam room while observing appropriate contact time as indicated for disinfecting solutions.

## 2021-10-04 NOTE — Assessment & Plan Note (Signed)
-  Td 2016.  -Shingrix: declines   - PNM 23: 06/2019; PNM 13 :10/04/2020 -COVID VAX - UTD - no flu shot this season   -CCS: Cscope 2009, no polyps; IFOB (-) 2022, request GI referral >> sent  - Female care: [facility] 10-2020 (K PN), next gyn OV 11-2021 -T score -1.9 (09-2017) , rec  calcium and vitamin D. -Lifestyle: walking less this year, diet is ok; rec increase physical activity -Labs:   CMP, FLP, CBC, TSH - ACP discussed.

## 2021-10-04 NOTE — Assessment & Plan Note (Signed)
Here for CPX Hypothyroidism: Check TSH Migraines: Not an issue at this time Vertigo: Ongoing since mid December 2022, neurological exam remains normal.  Due to persistent symptoms recommend a brain MRI and referred to physical therapy for vestibular rehabilitation. RTC 4 months

## 2021-10-04 NOTE — Patient Instructions (Addendum)
We are referring you to gastroenterology to schedule a colonoscopy.  Their number is [phone]  We will schedule a brain MRI.  Go downstairs and schedule a bone density test  In addition to your multivitamin, take vitamin D3: 1000 units daily  GO TO THE LAB : Get the blood work    [place], [place] back for a checkup in 4 months    "Living will", "[place] of attorney": Advanced care planning  (If you already have a living will or healthcare power of attorney, please bring the copy to be scanned in your chart.)  Advance care planning is a process that supports adults in  understanding and sharing their preferences regarding future medical care.   The patient's preferences are recorded in documents called Advance Directives.    Advanced directives are completed (and can be modified at any time) while the patient is in full mental capacity.   The documentation should be available at all times to the patient, the family and the healthcare providers.  Bring in a copy to be scanned in your chart is an excellent idea and is recommended   This legal documents direct treatment decision making and/or appoint a surrogate to make the decision if the patient is not capable to do so.    Advance directives can be documented in many types of formats,  documents have names such as:  Lliving will  Durable power of attorney for healthcare (healthcare proxy or healthcare power of attorney)  Combined directives  Physician orders for life-sustaining treatment    More information at:  [email or web address]

## 2021-10-05 LAB — CBC WITH DIFFERENTIAL/PLATELET
Basophils Absolute: 0.1 10*3/uL (ref 0.0–0.1)
Basophils Relative: 1 % (ref 0.0–3.0)
Eosinophils Absolute: 0.1 10*3/uL (ref 0.0–0.7)
Eosinophils Relative: 1 % (ref 0.0–5.0)
HCT: 41.3 % (ref 36.0–46.0)
Hemoglobin: 14 g/dL (ref 12.0–15.0)
Lymphocytes Relative: 27.1 % (ref 12.0–46.0)
Lymphs Abs: 1.8 10*3/uL (ref 0.7–4.0)
MCHC: 33.8 g/dL (ref 30.0–36.0)
MCV: 92 fl (ref 78.0–100.0)
Monocytes Absolute: 0.5 10*3/uL (ref 0.1–1.0)
Monocytes Relative: 7.6 % (ref 3.0–12.0)
Neutro Abs: 4.3 10*3/uL (ref 1.4–7.7)
Neutrophils Relative %: 63.3 % (ref 43.0–77.0)
Platelets: 241 10*3/uL (ref 150.0–400.0)
RBC: 4.49 Mil/uL (ref 3.87–5.11)
RDW: 13.4 % (ref 11.5–15.5)
WBC: 6.8 10*3/uL (ref 4.0–10.5)

## 2021-10-05 LAB — COMPREHENSIVE METABOLIC PANEL
ALT: 12 U/L (ref 0–35)
AST: 14 U/L (ref 0–37)
Albumin: 4.4 g/dL (ref 3.5–5.2)
Alkaline Phosphatase: 92 U/L (ref 39–117)
BUN: 19 mg/dL (ref 6–23)
CO2: 29 mEq/L (ref 19–32)
Calcium: 9.1 mg/dL (ref 8.4–10.5)
Chloride: 105 mEq/L (ref 96–112)
Creatinine, Ser: 0.97 mg/dL (ref 0.40–1.20)
GFR: 59.72 mL/min — ABNORMAL LOW (ref >60.00)
Glucose, Bld: 82 mg/dL (ref 70–99)
Potassium: 4.7 mEq/L (ref 3.5–5.1)
Sodium: 141 mEq/L (ref 135–145)
Total Bilirubin: 1.1 mg/dL (ref 0.2–1.2)
Total Protein: 7 g/dL (ref 6.0–8.3)

## 2021-10-05 LAB — LIPID PANEL
Cholesterol: 224 mg/dL — ABNORMAL HIGH (ref 0–200)
HDL: 47.5 mg/dL (ref >39.00)
LDL Cholesterol: 144 mg/dL — ABNORMAL HIGH (ref 0–99)
NonHDL: 176.06
Total CHOL/HDL Ratio: 5
Triglycerides: 158 mg/dL — ABNORMAL HIGH (ref 0.0–149.0)
VLDL: 31.6 mg/dL (ref 0.0–40.0)

## 2021-10-05 LAB — TSH: TSH: 1.08 u[IU]/mL (ref 0.35–5.50)

## 2021-10-06 ENCOUNTER — Telehealth (HOSPITAL_BASED_OUTPATIENT_CLINIC_OR_DEPARTMENT_OTHER): Payer: Self-pay

## 2021-10-11 ENCOUNTER — Other Ambulatory Visit: Payer: Self-pay

## 2021-10-11 ENCOUNTER — Ambulatory Visit (HOSPITAL_BASED_OUTPATIENT_CLINIC_OR_DEPARTMENT_OTHER)
Admission: RE | Admit: 2021-10-11 | Discharge: 2021-10-11 | Disposition: A | Discharge status: Home / Self Care | Payer: Medicare HMO | Source: Ambulatory Visit | Attending: Internal Medicine | Admitting: Internal Medicine

## 2021-10-11 DIAGNOSIS — Z780 Asymptomatic menopausal state: Secondary | ICD-10-CM | POA: Insufficient documentation

## 2021-10-11 DIAGNOSIS — M8589 Other specified disorders of bone density and structure, multiple sites: Secondary | ICD-10-CM | POA: Diagnosis not present

## 2021-10-13 ENCOUNTER — Other Ambulatory Visit: Payer: Self-pay

## 2021-10-13 ENCOUNTER — Ambulatory Visit (HOSPITAL_BASED_OUTPATIENT_CLINIC_OR_DEPARTMENT_OTHER)
Admission: RE | Admit: 2021-10-13 | Discharge: 2021-10-13 | Disposition: A | Discharge status: Home / Self Care | Payer: Medicare HMO | Source: Ambulatory Visit | Attending: Internal Medicine | Admitting: Internal Medicine

## 2021-10-13 DIAGNOSIS — G9389 Other specified disorders of brain: Secondary | ICD-10-CM | POA: Diagnosis not present

## 2021-10-13 DIAGNOSIS — R42 Dizziness and giddiness: Secondary | ICD-10-CM | POA: Insufficient documentation

## 2021-10-20 ENCOUNTER — Encounter: Payer: Self-pay | Admitting: Physician Assistant

## 2021-10-20 ENCOUNTER — Ambulatory Visit: Payer: Medicare HMO | Admitting: Physician Assistant

## 2021-10-20 ENCOUNTER — Other Ambulatory Visit: Payer: Self-pay

## 2021-10-20 DIAGNOSIS — Z1283 Encounter for screening for malignant neoplasm of skin: Secondary | ICD-10-CM | POA: Diagnosis not present

## 2021-10-20 DIAGNOSIS — L820 Inflamed seborrheic keratosis: Secondary | ICD-10-CM | POA: Diagnosis not present

## 2021-10-20 NOTE — Progress Notes (Signed)
? ?  New Patient ?  ?Subjective  ?Shannon Mendez is a 71 y.o. female who presents for the following: New Patient (Initial Visit) (Patient here today with husband to check lesions on body possible keratosis. Also has lesion on left side face that has been there x 1 year. It does bother patient. /No history of skin cancers.  ). ? ? ?The following portions of the chart were reviewed this encounter and updated as appropriate:  Tobacco  Allergies  Meds  Problems  Med Hx  Surg Hx  Fam Hx   ?  ? ?Objective  ?Well appearing patient in no apparent distress; mood and affect are within normal limits. ? ?A full examination was performed including scalp, head, eyes, ears, nose, lips, neck, chest, axillae, abdomen, back, buttocks, bilateral upper extremities, bilateral lower extremities, hands, feet, fingers, toes, fingernails, and toenails. All findings within normal limits unless otherwise noted below. ? ?No atypical nevi or signs of NMSC noted at the time of the visit.  ? ?Left Flank, Left Parotid Area ?Brown crusted plaques ? ? ?Assessment & Plan  ?Skin exam for malignant neoplasm ? ?Yearly skin check.  ? ?Inflamed seborrheic keratosis (2) ?Left Flank; Left Parotid Area ? ?Destruction of lesion - Left Flank, Left Parotid Area ?Complexity: simple   ?Destruction method: cryotherapy   ?Informed consent: discussed and consent obtained   ?Timeout:  patient name, date of birth, surgical site, and procedure verified ?Lesion destroyed using liquid nitrogen: Yes   ?Region frozen until ice ball extended beyond lesion: Yes   ?Cryotherapy cycles:  3 ?Outcome: patient tolerated procedure well with no complications   ?Post-procedure details: wound care instructions given   ? ? ?No atypical nevi noted at the time of the visit. ? ?I, Aniya Jolicoeur, PA-C, have reviewed all documentation's for this visit.  The documentation on 10/20/21 for the exam, diagnosis, procedures and orders are all accurate and complete. ?

## 2021-10-20 NOTE — Patient Instructions (Signed)
Seborrheic Keratosis A seborrheic keratosis is a common, noncancerous (benign) skin growth. These growths are velvety, waxy, rough, tan, brown, or black spots that appear on the skin. These skin growths can be flat or raised, andscaly. What are the causes? The cause of this condition is not known. What increases the risk? You are more likely to develop this condition if you: Have a family history of seborrheic keratosis. Are 50 or older. Are pregnant. Have had estrogen replacement therapy. What are the signs or symptoms? Symptoms of this condition include growths on the face, chest, shoulders, back, or other areas. These growths: Are usually painless, but may become irritated and itchy. Can be yellow, brown, black, or other colors. Are slightly raised or have a flat surface. Are sometimes rough or wart-like in texture. Are often velvety or waxy on the surface. Are round or oval-shaped. Often occur in groups, but may occur as a single growth. How is this diagnosed? This condition is diagnosed with a medical history and physical exam. A sample of the growth may be tested (skin biopsy). You may need to see a skin specialist (dermatologist). How is this treated? Treatment is not usually needed for this condition, unless the growths are irritated or bleed often. You may also choose to have the growths removed if you do not like their appearance. Most commonly, these growths are treated with a procedure in which liquid nitrogen is applied to "freeze" off the growth (cryosurgery). They may also be burned off with electricity (electrocautery) or removed by scraping (curettage). Follow these instructions at home: Watch your growth for any changes. Keep all follow-up visits as told by your health care provider. This is important. Do not scratch or pick at the growth or growths. This can cause them to become irritated or infected. Contact a health care provider if: You suddenly have many new  growths. Your growth bleeds, itches, or hurts. Your growth suddenly becomes larger or changes color. Summary A seborrheic keratosis is a common, noncancerous (benign) skin growth. Treatment is not usually needed for this condition, unless the growths are irritated or bleed often. Watch your growth for any changes. Contact a health care provider if you suddenly have many new growths or your growth suddenly becomes larger or changes color. Keep all follow-up visits as told by your health care provider. This is important. This information is not intended to replace advice given to you by your health care provider. Make sure you discuss any questions you have with your healthcare provider. Document Revised: 12/07/2017 Document Reviewed: 12/07/2017 Elsevier Patient Education  2022 Elsevier Inc.  

## 2021-11-24 DIAGNOSIS — Z1231 Encounter for screening mammogram for malignant neoplasm of breast: Secondary | ICD-10-CM | POA: Diagnosis not present

## 2021-11-24 DIAGNOSIS — Z6826 Body mass index (BMI) 26.0-26.9, adult: Secondary | ICD-10-CM | POA: Diagnosis not present

## 2021-11-24 DIAGNOSIS — Z01419 Encounter for gynecological examination (general) (routine) without abnormal findings: Secondary | ICD-10-CM | POA: Diagnosis not present

## 2021-11-24 LAB — HM MAMMOGRAPHY

## 2021-12-07 ENCOUNTER — Encounter: Payer: Self-pay | Admitting: Gastroenterology

## 2021-12-21 ENCOUNTER — Other Ambulatory Visit: Payer: Self-pay | Admitting: Internal Medicine

## 2021-12-22 ENCOUNTER — Encounter: Payer: Self-pay | Admitting: Internal Medicine

## 2022-01-25 ENCOUNTER — Ambulatory Visit (AMBULATORY_SURGERY_CENTER): Payer: Medicare HMO | Admitting: *Deleted

## 2022-01-25 VITALS — Ht 64.0 in | Wt 146.0 lb

## 2022-01-25 DIAGNOSIS — Z1211 Encounter for screening for malignant neoplasm of colon: Secondary | ICD-10-CM

## 2022-01-25 MED ORDER — NA SULFATE-K SULFATE-MG SULF 17.5-3.13-1.6 GM/177ML PO SOLN: 1.0000 | ORAL | 0 refills | Status: DC

## 2022-01-25 NOTE — Progress Notes (Signed)
Patient's pre-visit was done today over the phone with the patient. Name,DOB and address verified. Patient denies any allergies to Eggs and Soy. Patient denies any problems with anesthesia/sedation. Patient is not taking any diet pills or blood thinners. No home Oxygen. Insurance confirmed with patient.  Prep instructions sent to pt's MyChart & mailed to pt-pt is aware. Patient understands to call us back with any questions or concerns. Patient is aware of our care-partner policy. Patient will use [facility] for prep.  EMMI education assigned to the patient for the procedure, sent to [place].

## 2022-02-01 ENCOUNTER — Encounter: Payer: Self-pay | Admitting: Internal Medicine

## 2022-02-01 ENCOUNTER — Telehealth: Payer: Self-pay | Admitting: Gastroenterology

## 2022-02-01 ENCOUNTER — Ambulatory Visit (INDEPENDENT_AMBULATORY_CARE_PROVIDER_SITE_OTHER): Payer: Medicare HMO | Admitting: Internal Medicine

## 2022-02-01 VITALS — BP 120/66 | HR 61 | Temp 98.3°F | Resp 16 | Ht 64.0 in | Wt 146.0 lb

## 2022-02-01 DIAGNOSIS — M722 Plantar fascial fibromatosis: Secondary | ICD-10-CM | POA: Diagnosis not present

## 2022-02-01 DIAGNOSIS — R42 Dizziness and giddiness: Secondary | ICD-10-CM | POA: Diagnosis not present

## 2022-02-01 NOTE — Telephone Encounter (Signed)
Spoke with pt who again stated she absolutely will not drink Suprep after reading about the side effects.  She says, "my husband and all my friends in [place] have only taken over the counter medications for this; never a prescription."  Miralax split prep offered and pt is agreeable to this. New instructions sent via [facility] and mail.    She says she is going to take the Suprep back to the pharmacy; asks if they will take this back.  I told her I am unsure of this- she will have to check with her pharmacy; understanding voiced.

## 2022-02-02 DIAGNOSIS — J340 Abscess, furuncle and carbuncle of nose: Secondary | ICD-10-CM | POA: Diagnosis not present

## 2022-02-02 DIAGNOSIS — R040 Epistaxis: Secondary | ICD-10-CM | POA: Diagnosis not present

## 2022-02-15 ENCOUNTER — Encounter: Payer: Self-pay | Admitting: Gastroenterology

## 2022-02-15 ENCOUNTER — Encounter: Payer: Self-pay | Admitting: Certified Registered Nurse Anesthetist

## 2022-02-18 ENCOUNTER — Encounter: Payer: Self-pay | Admitting: Gastroenterology

## 2022-02-18 ENCOUNTER — Ambulatory Visit (AMBULATORY_SURGERY_CENTER): Payer: Medicare HMO | Admitting: Gastroenterology

## 2022-02-18 VITALS — BP 113/59 | HR 63 | Temp 97.3°F | Resp 12 | Ht 64.0 in | Wt 146.0 lb

## 2022-02-18 DIAGNOSIS — D125 Benign neoplasm of sigmoid colon: Secondary | ICD-10-CM | POA: Diagnosis not present

## 2022-02-18 DIAGNOSIS — Z1211 Encounter for screening for malignant neoplasm of colon: Secondary | ICD-10-CM | POA: Diagnosis not present

## 2022-02-18 DIAGNOSIS — D121 Benign neoplasm of appendix: Secondary | ICD-10-CM

## 2022-02-18 DIAGNOSIS — D122 Benign neoplasm of ascending colon: Secondary | ICD-10-CM | POA: Diagnosis not present

## 2022-02-18 MED ORDER — SODIUM CHLORIDE 0.9 % IV SOLN: 500.0000 mL | Freq: Once | INTRAVENOUS | Status: DC

## 2022-02-18 NOTE — Progress Notes (Signed)
colonoscopy 01/2008 in [place];  Dr. Almeta Monas.  The report is half hand-written, half typed.  No pictures.  He documented the test was done for "screening" and "chronic constipation" and "pink or red material on toilet paper."  Exam was to the cecum, prep was good.  No polyps were found, she did have hemorrhoids. He recommended repeat colonoscopy in 6 years.   HPI: This is a woman at routine risk fro CRC   ROS: complete GI ROS as described in HPI, all other review negative.  Constitutional:  No unintentional weight loss   Past Medical History:  Diagnosis Date   Fibrous papule of nose 2016   Benign   Goiter 08/15/2014   History of chicken pox    Hypothyroid    Migraine    Rheumatic fever    as a child   Vitreous detachment    2011    Past Surgical History:  Procedure Laterality Date   COLONOSCOPY  2009   in [place]-normal exam prep good   TONSILLECTOMY AND ADENOIDECTOMY      Current Outpatient Medications  Medication Sig Dispense Refill   gentamicin ointment (GARAMYCIN) 0.1 % Apply topically.     levothyroxine (SYNTHROID) 75 MCG tablet TAKE 75MCG ON T, TH, SAT, AND SUNDAY 90 tablet 1   acetaminophen (TYLENOL) 500 MG tablet Take 500 mg by mouth every 6 (six) hours as needed.     Cholecalciferol (VITAMIN D3 PO) Take by mouth.     levothyroxine (SYNTHROID) 50 MCG tablet 50 MCG MONDAY-WEDNESDAY-FRIDAY 90 tablet 1   meclizine (ANTIVERT) 25 MG tablet Take 1 tablet (25 mg total) by mouth at bedtime as needed for dizziness. 30 tablet 0   SUMAtriptan (IMITREX) 50 MG tablet Take 1 tablet (50 mg total) by mouth every 2 (two) hours as needed for migraine. No more than 2 pills in a 24 hour period (Patient not taking: Reported on 01/25/2022) 10 tablet 3   Current Facility-Administered Medications  Medication Dose Route Frequency Provider Last Rate Last Admin   0.9 %  sodium chloride infusion  500 mL Intravenous Once Milus Banister, MD        Allergies as of 02/18/2022 - Review  Complete 02/18/2022  Allergen Reaction Noted   Penicillins Hives 09/18/2019   Amoxicillin Hives 08/15/2014    Family History  Problem Relation Age of Onset   Ovarian cancer Mother        in her 51s   Lung cancer Father        smoker   Aneurysm Father        Aorta A   Diabetes Other        GM ?   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  Tobacco Use   Smoking status: Never   Smokeless tobacco: Never  Vaping Use   Vaping Use: Never used  Substance and Sexual Activity   Alcohol use: Yes    Comment: Occasional-wine   Drug use: No   Sexual activity: Not on file  Other Topics Concern   Not on file  Social History Narrative   Moved from [place] 2014   Has a son ([place]) and a  daughter (she lives in [place]) they were born in the 56s       Social Determinants of Health   Financial Resource Strain: Low Risk  (04/08/2021)   Overall Financial Resource Strain (CARDIA)    Difficulty of Paying Living Expenses: Not hard at all  Food Insecurity: No Food Insecurity (04/08/2021)   Hunger Vital Sign    Worried About Running Out of Food in the Last Year: Never true    [place] in the Last Year: Never true  Transportation Needs: No Transportation Needs (04/08/2021)   PRAPARE - Hydrologist (Medical): No    Lack of Transportation (Non-Medical): No  Physical Activity: Insufficiently Active (04/08/2021)   Exercise Vital Sign    Days of Exercise per Week: 3 days    Minutes of Exercise per Session: 30 min  Stress: No Stress Concern Present (04/08/2021)   [place]    Feeling of Stress : Not at all  Social Connections: Moderately Isolated (04/08/2021)   Social Connection and Isolation Panel [NHANES]    Frequency of Communication with Friends and Family: More than three times a week    Frequency of Social Gatherings with Friends and Family: More than three times a week    Attends Religious Services: Never    Marine scientist or Organizations: No    Attends Archivist Meetings: Never    Marital Status: Married  Human resources officer Violence: Not At Risk (04/08/2021)   Humiliation, Afraid, Rape, and Kick questionnaire    Fear of Current or Ex-Partner: No    Emotionally Abused: No    Physically Abused: No    Sexually Abused: No     Physical Exam: BP (!) 151/71   Pulse 73   Temp (!) 97.3 F (36.3 C)   Resp 15   Ht '5\' 4"'$  (1.626 m)   Wt 146 lb (66.2 kg)   SpO2 100%   BMI 25.06 kg/m  Constitutional: generally well-appearing Psychiatric: alert and oriented x3 Lungs: CTA bilaterally Heart: no MCR  Assessment and plan: 71 y.o. female with routine risk for CRC  Screening colonoscopy today  Care is appropriate for the ambulatory setting.  Owens Loffler, MD [facility] 02/18/2022, 9:28 AM

## 2022-02-18 NOTE — Patient Instructions (Signed)
Await pathology results.  Handout on polyps given.  YOU HAD AN ENDOSCOPIC PROCEDURE TODAY AT [facility]:   Refer to the procedure report that was given to you for any specific questions about what was found during the examination.  If the procedure report does not answer your questions, please call your gastroenterologist to clarify.  If you requested that your care partner not be given the details of your procedure findings, then the procedure report has been included in a sealed envelope for you to review at your convenience later.  YOU SHOULD EXPECT: Some feelings of bloating in the abdomen. Passage of more gas than usual.  Walking can help get rid of the air that was put into your GI tract during the procedure and reduce the bloating. If you had a lower endoscopy (such as a colonoscopy or flexible sigmoidoscopy) you may notice spotting of blood in your stool or on the toilet paper. If you underwent a bowel prep for your procedure, you may not have a normal bowel movement for a few days.  Please Note:  You might notice some irritation and congestion in your nose or some drainage.  This is from the oxygen used during your procedure.  There is no need for concern and it should clear up in a day or so.  SYMPTOMS TO REPORT IMMEDIATELY:  Following lower endoscopy (colonoscopy or flexible sigmoidoscopy):  Excessive amounts of blood in the stool  Significant tenderness or worsening of abdominal pains  Swelling of the abdomen that is new, acute  Fever of 100F or higher  For urgent or emergent issues, a gastroenterologist can be reached at any hour by calling [phone]. Do not use MyChart messaging for urgent concerns.    DIET:  We do recommend a small meal at first, but then you may proceed to your regular diet.  Drink plenty of fluids but you should avoid alcoholic beverages for 24 hours.  ACTIVITY:  You should plan to take it easy for the rest of today and you should NOT  DRIVE or use heavy machinery until tomorrow (because of the sedation medicines used during the test).    FOLLOW UP: Our staff will call the number listed on your records the next business day following your procedure.  We will call around 7:15- 8:00 am to check on you and address any questions or concerns that you may have regarding the information given to you following your procedure. If we do not reach you, we will leave a message.  If you develop any symptoms (ie: fever, flu-like symptoms, shortness of breath, cough etc.) before then, please call [phone].  If you test positive for Covid 19 in the 2 weeks post procedure, please call and report this information to [place].    If any biopsies were taken you will be contacted by phone or by letter within the next 1-3 weeks.  Please call us at [phone] if you have not heard about the biopsies in 3 weeks.    SIGNATURES/CONFIDENTIALITY: You and/or your care partner have signed paperwork which will be entered into your electronic medical record.  These signatures attest to the fact that that the information above on your After Visit Summary has been reviewed and is understood.  Full responsibility of the confidentiality of this discharge information lies with you and/or your care-partner.

## 2022-02-18 NOTE — Op Note (Signed)
[place] Patient Name: Shannon Mendez Procedure Date: 02/18/2022 9:25 AM MRN: [id number] Endoscopist: Milus Banister , MD Age: 71 Referring MD:  Date of Birth: 1951-02-26 Gender: Female Account #: [id number] Procedure:                Colonoscopy Indications:              Screening for colorectal malignant neoplasm;                            colonoscopy 01/2008 in [place]; Dr. Almeta Monas.                            The report is half hand-written, half typed. No                            pictures. He documented the test was done for                            "screening" and "chronic constipation" and "pink or                            red material on toilet paper." Exam was to the                            cecum, prep was good. No polyps were found, she                            did have hemorrhoids. He recommended repeat                            colonoscopy in 6 years. Medicines:                Monitored Anesthesia Care Procedure:                Pre-Anesthesia Assessment:                           - Prior to the procedure, a History and Physical                            was performed, and patient medications and                            allergies were reviewed. The patient's tolerance of                            previous anesthesia was also reviewed. The risks                            and benefits of the procedure and the sedation                            options and risks were discussed with the patient.  All questions were answered, and informed consent                            was obtained. Prior Anticoagulants: The patient has                            taken no previous anticoagulant or antiplatelet                            agents. ASA Grade Assessment: II - A patient with                            mild systemic disease. After reviewing the risks                            and benefits, the patient was  deemed in                            satisfactory condition to undergo the procedure.                           After obtaining informed consent, the colonoscope                            was passed under direct vision. Throughout the                            procedure, the patient's blood pressure, pulse, and                            oxygen saturations were monitored continuously. The                            Olympus CF-HQ190L ([id number]) Colonoscope was                            introduced through the anus and advanced to the the                            cecum, identified by appendiceal orifice and                            ileocecal valve. The colonoscopy was performed                            without difficulty. The patient tolerated the                            procedure well. The quality of the bowel                            preparation was good. The ileocecal valve,  appendiceal orifice, and rectum were photographed. Scope In: 9:37:55 AM Scope Out: 9:52:30 AM Scope Withdrawal Time: 0 hours 9 minutes 41 seconds  Total Procedure Duration: 0 hours 14 minutes 35 seconds  Findings:                 Two sessile polyps were found in the sigmoid colon                            and ascending colon. The polyps were 3 to 4 mm in                            size. These polyps were removed with a cold snare.                            Resection and retrieval were complete.                           A 1.5cm nodular, polypoid lesion was found at the                            appendiceal orifice. This was clearly not                            resectable endoscopically. Biopsies were taken with                            a cold forceps for histology. Some of the biopsy                            sites felt firm.                           The exam was otherwise without abnormality on                            direct and retroflexion views. Complications:             No immediate complications. Estimated blood loss:                            None. Estimated Blood Loss:     Estimated blood loss: none. Impression:               - Two 3 to 4 mm polyps in the sigmoid colon and in                            the ascending colon, removed with a cold snare.                            Resected and retrieved.                           - A 1.5cm nodular, polypoid lesion was found at the  appendiceal orifice. This was clearly not                            resectable endoscopically. Biopsies were taken with                            a cold forceps for histology. Some of the biopsy                            sites felt firm.                           - The examination was otherwise normal on direct                            and retroflexion views. Recommendation:           - Patient has a contact number available for                            emergencies. The signs and symptoms of potential                            delayed complications were discussed with the                            patient. Return to normal activities tomorrow.                            Written discharge instructions were provided to the                            patient.                           - Resume previous diet.                           - Continue present medications.                           - Await pathology results. Milus Banister, MD 02/18/2022 9:57:25 AM This report has been signed electronically.

## 2022-02-18 NOTE — Progress Notes (Signed)
Report given to PACU, vss 

## 2022-02-21 ENCOUNTER — Encounter: Payer: Self-pay | Admitting: Internal Medicine

## 2022-02-21 ENCOUNTER — Telehealth: Payer: Self-pay

## 2022-02-21 DIAGNOSIS — Z801 Family history of malignant neoplasm of trachea, bronchus and lung: Secondary | ICD-10-CM | POA: Diagnosis not present

## 2022-02-21 DIAGNOSIS — Z880 Allergy status to penicillin: Secondary | ICD-10-CM | POA: Diagnosis not present

## 2022-02-21 DIAGNOSIS — M8580 Other specified disorders of bone density and structure, unspecified site: Secondary | ICD-10-CM | POA: Diagnosis not present

## 2022-02-21 DIAGNOSIS — Z008 Encounter for other general examination: Secondary | ICD-10-CM | POA: Diagnosis not present

## 2022-02-21 DIAGNOSIS — E039 Hypothyroidism, unspecified: Secondary | ICD-10-CM | POA: Diagnosis not present

## 2022-02-21 DIAGNOSIS — Z8041 Family history of malignant neoplasm of ovary: Secondary | ICD-10-CM | POA: Diagnosis not present

## 2022-02-21 DIAGNOSIS — Z825 Family history of asthma and other chronic lower respiratory diseases: Secondary | ICD-10-CM | POA: Diagnosis not present

## 2022-02-21 NOTE — Telephone Encounter (Signed)
  Follow up Call-     02/18/2022    9:05 AM  Call back number  Post procedure Call Back phone  # [phone]  Permission to leave phone message Yes     Patient questions:  Do you have a fever, pain , or abdominal swelling? No. Pain Score  0 *  Have you tolerated food without any problems? Yes.    Have you been able to return to your normal activities? Yes.    Do you have any questions about your discharge instructions: Diet   No. Medications  No. Follow up visit  No.  Do you have questions or concerns about your Care? No.  Actions: * If pain score is 4 or above: No action needed, pain <4.

## 2022-02-22 ENCOUNTER — Encounter: Payer: Self-pay | Admitting: Internal Medicine

## 2022-03-28 DIAGNOSIS — D122 Benign neoplasm of ascending colon: Secondary | ICD-10-CM | POA: Diagnosis not present

## 2022-03-31 ENCOUNTER — Encounter: Payer: Self-pay | Admitting: Internal Medicine

## 2022-04-12 NOTE — Progress Notes (Signed)
Sent message, via epic in basket, requesting orders in epic from surgeon.  

## 2022-04-13 ENCOUNTER — Ambulatory Visit: Payer: Self-pay | Admitting: Surgery

## 2022-04-13 DIAGNOSIS — Z01818 Encounter for other preprocedural examination: Secondary | ICD-10-CM

## 2022-04-18 NOTE — Patient Instructions (Signed)
DUE TO COVID-19 ONLY TWO VISITORS  (aged 70 and older)  ARE ALLOWED TO COME WITH YOU AND STAY IN THE WAITING ROOM ONLY DURING PRE OP AND PROCEDURE.   **NO VISITORS ARE ALLOWED IN THE SHORT STAY AREA OR RECOVERY ROOM!!**  IF YOU WILL BE ADMITTED INTO THE HOSPITAL YOU ARE ALLOWED ONLY FOUR SUPPORT PEOPLE DURING VISITATION HOURS ONLY (7 AM -8PM)   The support person(s) must pass our screening, gel in and out, and wear a mask at all times, including in the patient's room. Patients must also wear a mask when staff or their support person are in the room. Visitors GUEST BADGE MUST BE WORN VISIBLY  One adult visitor may remain with you overnight and MUST be in the room by 8 P.M.     Your procedure is scheduled on: 04/29/22   Report to [facility] Main Entrance    Report to admitting at : 10:45 AM   Call this number if you have problems the morning of surgery [phone]  Clear liquid starting day before surgery until : 10:00 AM DAY OF SURGERY  Water Black Coffee (sugar ok, NO MILK/CREAM OR CREAMERS)  Tea (sugar ok, NO MILK/CREAM OR CREAMERS) regular and decaf                             Plain Jell-O (NO RED)                                           Fruit ices (not with fruit pulp, NO RED)                                     Popsicles (NO RED)                                                                  Juice: apple, WHITE grape, WHITE cranberry Sports drinks like Gatorade (NO RED)              DRINK 2 PRESURGERY ENSURE DRINKS THE NIGHT BEFORE SURGERY AT  1000 PM AND 1 PRESURGERY DRINK THE DAY OF THE PROCEDURE 3 HOURS PRIOR TO SCHEDULED SURGERY. NO SOLIDS AFTER MIDNIGHT THE DAY PRIOR TO THE SURGERY. NOTHING BY MOUTH EXCEPT CLEAR LIQUIDS UNTIL THREE HOURS PRIOR TO SCHEDULED SURGERY. PLEASE FINISH PRESURGERY ENSURE DRINK PER SURGEON ORDER 3 HOURS PRIOR TO SCHEDULED SURGERY TIME WHICH NEEDS TO BE COMPLETED AT : 10:00 AM.     The day of surgery:  Drink ONE (1) Pre-Surgery Clear  Ensure or G2 at AM the morning of surgery. Drink in one sitting. Do not sip.  This drink was given to you during your hospital  pre-op appointment visit. Nothing else to drink after completing the  Pre-Surgery Clear Ensure or G2.          If you have questions, please contact your surgeon's office.   FOLLOW BOWEL PREP AND ANY ADDITIONAL PRE OP INSTRUCTIONS YOU RECEIVED FROM YOUR SURGEON'S OFFICE!!!   Oral Hygiene is also important to reduce your  risk of infection.                                    Remember - BRUSH YOUR TEETH THE MORNING OF SURGERY WITH YOUR REGULAR TOOTHPASTE   Do NOT smoke after Midnight   Take these medicines the morning of surgery with A SIP OF WATER: synthroid.Tylenol as needed.  DO NOT TAKE ANY ORAL DIABETIC MEDICATIONS DAY OF YOUR SURGERY  Bring CPAP mask and tubing day of surgery.                              You may not have any metal on your body including hair pins, jewelry, and body piercing             Do not wear make-up, lotions, powders, perfumes/cologne, or deodorant  Do not wear nail polish including gel and S&S, artificial/acrylic nails, or any other type of covering on natural nails including finger and toenails. If you have artificial nails, gel coating, etc. that needs to be removed by a nail salon please have this removed prior to surgery or surgery may need to be canceled/ delayed if the surgeon/ anesthesia feels like they are unable to be safely monitored.   Do not shave  48 hours prior to surgery.    Do not bring valuables to the hospital. [place].   Contacts, dentures or bridgework may not be worn into surgery.   Bring small overnight bag day of surgery.   DO NOT [place]. PHARMACY WILL DISPENSE MEDICATIONS LISTED ON YOUR MEDICATION LIST TO YOU DURING YOUR ADMISSION [place]!    Patients discharged on the day of surgery will not be allowed to  drive home.  Someone NEEDS to stay with you for the first 24 hours after anesthesia.   Special Instructions: Bring a copy of your healthcare power of attorney and living will documents         the day of surgery if you haven't scanned them before.              Please read over the following fact sheets you were given: IF YOU HAVE QUESTIONS ABOUT YOUR PRE-OP INSTRUCTIONS PLEASE CALL [phone]     [facility] - Preparing for Surgery Before surgery, you can play an important role.  Because skin is not sterile, your skin needs to be as free of germs as possible.  You can reduce the number of germs on your skin by washing with CHG (chlorahexidine gluconate) soap before surgery.  CHG is an antiseptic cleaner which kills germs and bonds with the skin to continue killing germs even after washing. Please DO NOT use if you have an allergy to CHG or antibacterial soaps.  If your skin becomes reddened/irritated stop using the CHG and inform your nurse when you arrive at Short Stay. Do not shave (including legs and underarms) for at least 48 hours prior to the first CHG shower.  You may shave your face/neck. Please follow these instructions carefully:  1.  Shower with CHG Soap the night before surgery and the  morning of Surgery.  2.  If you choose to wash your hair, wash your hair first as usual with your  normal  shampoo.  3.  After you shampoo, rinse your hair and body thoroughly to remove the  shampoo.                           4.  Use CHG as you would any other liquid soap.  You can apply chg directly  to the skin and wash                       Gently with a scrungie or clean washcloth.  5.  Apply the CHG Soap to your body ONLY FROM THE NECK DOWN.   Do not use on face/ open                           Wound or open sores. Avoid contact with eyes, ears mouth and genitals (private parts).                       Wash face,  Genitals (private parts) with your normal soap.             6.  Wash thoroughly,  paying special attention to the area where your surgery  will be performed.  7.  Thoroughly rinse your body with warm water from the neck down.  8.  DO NOT shower/wash with your normal soap after using and rinsing off  the CHG Soap.                9.  Pat yourself dry with a clean towel.            10.  Wear clean pajamas.            11.  Place clean sheets on your bed the night of your first shower and do not  sleep with pets. Day of Surgery : Do not apply any lotions/deodorants the morning of surgery.  Please wear clean clothes to the hospital/surgery center.  FAILURE TO FOLLOW THESE INSTRUCTIONS MAY RESULT IN THE CANCELLATION OF YOUR SURGERY PATIENT SIGNATURE_________________________________  NURSE SIGNATURE__________________________________  ________________________________________________________________________ [facility] - Preparing for Surgery Before surgery, you can play an important role.  Because skin is not sterile, your skin needs to be as free of germs as possible.  You can reduce the number of germs on your skin by washing with CHG (chlorahexidine gluconate) soap before surgery.  CHG is an antiseptic cleaner which kills germs and bonds with the skin to continue killing germs even after washing. Please DO NOT use if you have an allergy to CHG or antibacterial soaps.  If your skin becomes reddened/irritated stop using the CHG and inform your nurse when you arrive at Short Stay. Do not shave (including legs and underarms) for at least 48 hours prior to the first CHG shower.  You may shave your face/neck. Please follow these instructions carefully:  1.  Shower with CHG Soap the night before surgery and the  morning of Surgery.  2.  If you choose to wash your hair, wash your hair first as usual with your  normal  shampoo.  3.  After you shampoo, rinse your hair and body thoroughly to remove the  shampoo.                           4.  Use CHG as you would any other liquid soap.  You  can apply chg directly  to the skin and wash                       Gently with a scrungie or clean washcloth.  5.  Apply the CHG Soap to your body ONLY FROM THE NECK DOWN.   Do not use on face/ open                           Wound or open sores. Avoid contact with eyes, ears mouth and genitals (private parts).                       Wash face,  Genitals (private parts) with your normal soap.             6.  Wash thoroughly, paying special attention to the area where your surgery  will be performed.  7.  Thoroughly rinse your body with warm water from the neck down.  8.  DO NOT shower/wash with your normal soap after using and rinsing off  the CHG Soap.                9.  Pat yourself dry with a clean towel.            10.  Wear clean pajamas.            11.  Place clean sheets on your bed the night of your first shower and do not  sleep with pets. Day of Surgery : Do not apply any lotions/deodorants the morning of surgery.  Please wear clean clothes to the hospital/surgery center.  FAILURE TO FOLLOW THESE INSTRUCTIONS MAY RESULT IN THE CANCELLATION OF YOUR SURGERY PATIENT SIGNATURE_________________________________  NURSE SIGNATURE__________________________________  ________________________________________________________________________   Shannon Mendez  An incentive spirometer is a tool that can help keep your lungs clear and active. This tool measures how well you are filling your lungs with each breath. Taking long deep breaths may help reverse or decrease the chance of developing breathing (pulmonary) problems (especially infection) following: A long period of time when you are unable to move or be active. BEFORE THE PROCEDURE  If the spirometer includes an indicator to show your best effort, your nurse or respiratory therapist will set it to a desired goal. If possible, sit up straight or lean slightly forward. Try not to slouch. Hold the incentive spirometer in an upright  position. INSTRUCTIONS FOR USE  Sit on the edge of your bed if possible, or sit up as far as you can in bed or on a chair. Hold the incentive spirometer in an upright position. Breathe out normally. Place the mouthpiece in your mouth and seal your lips tightly around it. Breathe in slowly and as deeply as possible, raising the piston or the ball toward the top of the column. Hold your breath for 3-5 seconds or for as long as possible. Allow the piston or ball to fall to the bottom of the column. Remove the mouthpiece from your mouth and breathe out normally. Rest for a few seconds and repeat Steps 1 through 7 at least 10 times every 1-2 hours when you are awake. Take your time and take a few normal breaths between deep breaths. The spirometer may include an indicator to show your best effort. Use the indicator as a goal to work toward during each repetition. After each set of 10 deep breaths, practice coughing to be  sure your lungs are clear. If you have an incision (the cut made at the time of surgery), support your incision when coughing by placing a pillow or rolled up towels firmly against it. Once you are able to get out of bed, walk around indoors and cough well. You may stop using the incentive spirometer when instructed by your caregiver.  RISKS AND COMPLICATIONS Take your time so you do not get dizzy or light-headed. If you are in pain, you may need to take or ask for pain medication before doing incentive spirometry. It is harder to take a deep breath if you are having pain. AFTER USE Rest and breathe slowly and easily. It can be helpful to keep track of a log of your progress. Your caregiver can provide you with a simple table to help with this. If you are using the spirometer at home, follow these instructions: [place] IF:  You are having difficultly using the spirometer. You have trouble using the spirometer as often as instructed. Your pain medication is not giving  enough relief while using the spirometer. You develop fever of 100.5 F (38.1 C) or higher. SEEK IMMEDIATE MEDICAL CARE IF:  You cough up bloody sputum that had not been present before. You develop fever of 102 F (38.9 C) or greater. You develop worsening pain at or near the incision site. MAKE SURE YOU:  Understand these instructions. Will watch your condition. Will get help right away if you are not doing well or get worse. Document Released: 12/05/2006 Document Revised: 10/17/2011 Document Reviewed: 02/05/2007 [facility] Patient Information 2014 [place], [place].   ________________________________________________________________________

## 2022-04-19 ENCOUNTER — Other Ambulatory Visit: Payer: Self-pay

## 2022-04-19 ENCOUNTER — Encounter (HOSPITAL_COMMUNITY): Payer: Self-pay

## 2022-04-19 ENCOUNTER — Encounter (HOSPITAL_COMMUNITY)
Admission: RE | Admit: 2022-04-19 | Discharge: 2022-04-19 | Disposition: A | Discharge status: Home / Self Care | Payer: Medicare HMO | Source: Ambulatory Visit | Attending: Surgery | Admitting: Surgery

## 2022-04-19 VITALS — BP 131/63 | HR 63 | Temp 98.5°F | Ht 64.0 in

## 2022-04-19 DIAGNOSIS — Z01812 Encounter for preprocedural laboratory examination: Secondary | ICD-10-CM | POA: Diagnosis present

## 2022-04-19 DIAGNOSIS — Z01818 Encounter for other preprocedural examination: Secondary | ICD-10-CM

## 2022-04-19 HISTORY — DX: Calcaneal spur, right foot: M77.31

## 2022-04-19 HISTORY — DX: Dizziness and giddiness: R42

## 2022-04-19 HISTORY — DX: Unspecified osteoarthritis, unspecified site: M19.90

## 2022-04-19 LAB — COMPREHENSIVE METABOLIC PANEL
ALT: 17 U/L (ref 0–44)
AST: 18 U/L (ref 15–41)
Albumin: 4.3 g/dL (ref 3.5–5.0)
Alkaline Phosphatase: 81 U/L (ref 38–126)
Anion gap: 5 (ref 5–15)
BUN: 17 mg/dL (ref 8–23)
CO2: 26 mmol/L (ref 22–32)
Calcium: 8.9 mg/dL (ref 8.9–10.3)
Chloride: 108 mmol/L (ref 98–111)
Creatinine, Ser: 0.86 mg/dL (ref 0.44–1.00)
GFR, Estimated: >60 mL/min (ref >60)
Glucose, Bld: 98 mg/dL (ref 70–99)
Potassium: 4.8 mmol/L (ref 3.5–5.1)
Sodium: 139 mmol/L (ref 135–145)
Total Bilirubin: 0.9 mg/dL (ref 0.3–1.2)
Total Protein: 7.3 g/dL (ref 6.5–8.1)

## 2022-04-19 LAB — CBC WITH DIFFERENTIAL/PLATELET
Abs Immature Granulocytes: 0.02 10*3/uL (ref 0.00–0.07)
Basophils Absolute: 0.1 10*3/uL (ref 0.0–0.1)
Basophils Relative: 1 %
Eosinophils Absolute: 0.1 10*3/uL (ref 0.0–0.5)
Eosinophils Relative: 1 %
HCT: 42.3 % (ref 36.0–46.0)
Hemoglobin: 13.8 g/dL (ref 12.0–15.0)
Immature Granulocytes: 0 %
Lymphocytes Relative: 27 %
Lymphs Abs: 2.2 10*3/uL (ref 0.7–4.0)
MCH: 30.9 pg (ref 26.0–34.0)
MCHC: 32.6 g/dL (ref 30.0–36.0)
MCV: 94.8 fL (ref 80.0–100.0)
Monocytes Absolute: 0.6 10*3/uL (ref 0.1–1.0)
Monocytes Relative: 8 %
Neutro Abs: 5.1 10*3/uL (ref 1.7–7.7)
Neutrophils Relative %: 63 %
Platelets: 253 10*3/uL (ref 150–400)
RBC: 4.46 MIL/uL (ref 3.87–5.11)
RDW: 13 % (ref 11.5–15.5)
WBC: 8 10*3/uL (ref 4.0–10.5)
nRBC: 0 % (ref 0.0–0.2)

## 2022-04-19 NOTE — Progress Notes (Signed)
For Short Stay: [place] appointment date: Date of COVID positive in last 32 days:  Bowel Prep reminder:   For Anesthesia: PCP - Dr. Kathlene November. Cardiologist -   Chest x-ray -  EKG -  Stress Test -  ECHO -  Cardiac Cath -  Pacemaker/ICD device last checked: Pacemaker orders received: Device Rep notified:  Spinal Cord Stimulator:  Sleep Study -  CPAP -   Fasting Blood Sugar -  Checks Blood Sugar _____ times a day Date and result of last Hgb A1c-  Blood Thinner Instructions: Aspirin Instructions: Last Dose:  Activity level: Can go up a flight of stairs and activities of daily living without stopping and without chest pain and/or shortness of breath   Able to exercise without chest pain and/or shortness of breath   Unable to go up a flight of stairs without chest pain and/or shortness of breath     Anesthesia review:   Patient denies shortness of breath, fever, cough and chest pain at PAT appointment   Patient verbalized understanding of instructions that were given to them at the PAT appointment. Patient was also instructed that they will need to review over the PAT instructions again at home before surgery.

## 2022-04-29 ENCOUNTER — Encounter (HOSPITAL_COMMUNITY)
Admission: RE | Disposition: A | Discharge status: Home / Self Care | Payer: Self-pay | Source: Home / Self Care | Attending: Surgery

## 2022-04-29 ENCOUNTER — Ambulatory Visit (HOSPITAL_COMMUNITY)
Admission: RE | Admit: 2022-04-29 | Discharge: 2022-04-29 | Disposition: A | Discharge status: Home / Self Care | Payer: Medicare HMO | Attending: Surgery | Admitting: Surgery

## 2022-04-29 ENCOUNTER — Encounter (HOSPITAL_COMMUNITY): Payer: Self-pay | Admitting: Surgery

## 2022-04-29 ENCOUNTER — Ambulatory Visit (HOSPITAL_BASED_OUTPATIENT_CLINIC_OR_DEPARTMENT_OTHER): Payer: Medicare HMO | Admitting: Anesthesiology

## 2022-04-29 ENCOUNTER — Ambulatory Visit (HOSPITAL_COMMUNITY): Payer: Medicare HMO | Admitting: Anesthesiology

## 2022-04-29 ENCOUNTER — Other Ambulatory Visit: Payer: Self-pay

## 2022-04-29 DIAGNOSIS — K388 Other specified diseases of appendix: Secondary | ICD-10-CM | POA: Diagnosis not present

## 2022-04-29 DIAGNOSIS — D122 Benign neoplasm of ascending colon: Secondary | ICD-10-CM | POA: Diagnosis not present

## 2022-04-29 DIAGNOSIS — D120 Benign neoplasm of cecum: Secondary | ICD-10-CM | POA: Diagnosis not present

## 2022-04-29 DIAGNOSIS — D121 Benign neoplasm of appendix: Secondary | ICD-10-CM | POA: Diagnosis not present

## 2022-04-29 HISTORY — PX: LAPAROSCOPIC APPENDECTOMY: SHX408

## 2022-04-29 LAB — TYPE AND SCREEN
ABO/RH(D): O POS
Antibody Screen: NEGATIVE

## 2022-04-29 LAB — ABO/RH: ABO/RH(D): O POS

## 2022-04-29 SURGERY — APPENDECTOMY, LAPAROSCOPIC
Anesthesia: General

## 2022-04-29 MED ORDER — ALVIMOPAN 12 MG PO CAPS
12.0000 mg | ORAL_CAPSULE | ORAL | Status: AC
  Administered 2022-04-29: 12 mg via ORAL
  Filled 2022-04-29: qty 1

## 2022-04-29 MED ORDER — POLYETHYLENE GLYCOL 3350 17 GM/SCOOP PO POWD
1.0000 | Freq: Once | ORAL | Status: DC
  Filled 2022-04-29: qty 255

## 2022-04-29 MED ORDER — EPHEDRINE SULFATE (PRESSORS) 50 MG/ML IJ SOLN
INTRAMUSCULAR | Status: DC | PRN
  Administered 2022-04-29 (×2): 5 mg via INTRAVENOUS

## 2022-04-29 MED ORDER — CHLORHEXIDINE GLUCONATE 0.12 % MT SOLN
15.0000 mL | Freq: Once | OROMUCOSAL | Status: AC
  Administered 2022-04-29: 15 mL via OROMUCOSAL

## 2022-04-29 MED ORDER — ENSURE PRE-SURGERY PO LIQD
296.0000 mL | Freq: Once | ORAL | Status: DC
  Filled 2022-04-29: qty 296

## 2022-04-29 MED ORDER — BUPIVACAINE LIPOSOME 1.3 % IJ SUSP: 20.0000 mL | Freq: Once | INTRAMUSCULAR | Status: DC

## 2022-04-29 MED ORDER — LACTATED RINGERS IV SOLN: INTRAVENOUS | Status: DC

## 2022-04-29 MED ORDER — MIDAZOLAM HCL 2 MG/2ML IJ SOLN
INTRAMUSCULAR | Status: AC
  Filled 2022-04-29: qty 2

## 2022-04-29 MED ORDER — NEOMYCIN SULFATE 500 MG PO TABS
1000.0000 mg | ORAL_TABLET | ORAL | Status: DC
  Filled 2022-04-29: qty 2

## 2022-04-29 MED ORDER — HEPARIN SODIUM (PORCINE) 5000 UNIT/ML IJ SOLN
5000.0000 [IU] | Freq: Once | INTRAMUSCULAR | Status: AC
  Administered 2022-04-29: 5000 [IU] via SUBCUTANEOUS
  Filled 2022-04-29: qty 1

## 2022-04-29 MED ORDER — HYDROMORPHONE HCL 1 MG/ML IJ SOLN
INTRAMUSCULAR | Status: AC
  Filled 2022-04-29: qty 2

## 2022-04-29 MED ORDER — METRONIDAZOLE 500 MG PO TABS
1000.0000 mg | ORAL_TABLET | ORAL | Status: DC
  Filled 2022-04-29: qty 2

## 2022-04-29 MED ORDER — LIDOCAINE 2% (20 MG/ML) 5 ML SYRINGE
INTRAMUSCULAR | Status: DC | PRN
  Administered 2022-04-29: 60 mg via INTRAVENOUS

## 2022-04-29 MED ORDER — KETAMINE HCL 10 MG/ML IJ SOLN
INTRAMUSCULAR | Status: DC | PRN
  Administered 2022-04-29: 10 mg via INTRAVENOUS

## 2022-04-29 MED ORDER — CHLORHEXIDINE GLUCONATE CLOTH 2 % EX PADS: 6.0000 | MEDICATED_PAD | Freq: Once | CUTANEOUS | Status: DC

## 2022-04-29 MED ORDER — LACTATED RINGERS IR SOLN
Status: DC | PRN
  Administered 2022-04-29: 1000 mL

## 2022-04-29 MED ORDER — PHENYLEPHRINE 80 MCG/ML (10ML) SYRINGE FOR IV PUSH (FOR BLOOD PRESSURE SUPPORT)
PREFILLED_SYRINGE | INTRAVENOUS | Status: DC | PRN
  Administered 2022-04-29 (×2): 80 ug via INTRAVENOUS

## 2022-04-29 MED ORDER — FENTANYL CITRATE (PF) 100 MCG/2ML IJ SOLN
INTRAMUSCULAR | Status: DC | PRN
  Administered 2022-04-29: 50 ug via INTRAVENOUS

## 2022-04-29 MED ORDER — ACETAMINOPHEN 500 MG PO TABS
1000.0000 mg | ORAL_TABLET | ORAL | Status: AC
  Administered 2022-04-29: 1000 mg via ORAL
  Filled 2022-04-29: qty 2

## 2022-04-29 MED ORDER — BUPIVACAINE-EPINEPHRINE (PF) 0.25% -1:200000 IJ SOLN
INTRAMUSCULAR | Status: DC | PRN
  Administered 2022-04-29: 25 mL

## 2022-04-29 MED ORDER — SUGAMMADEX SODIUM 200 MG/2ML IV SOLN
INTRAVENOUS | Status: DC | PRN
  Administered 2022-04-29: 200 mg via INTRAVENOUS

## 2022-04-29 MED ORDER — DEXAMETHASONE SODIUM PHOSPHATE 4 MG/ML IJ SOLN
INTRAMUSCULAR | Status: DC | PRN
  Administered 2022-04-29: 8 mg via INTRAVENOUS

## 2022-04-29 MED ORDER — BUPIVACAINE LIPOSOME 1.3 % IJ SUSP
INTRAMUSCULAR | Status: AC
  Filled 2022-04-29: qty 20

## 2022-04-29 MED ORDER — BISACODYL 5 MG PO TBEC: 20.0000 mg | DELAYED_RELEASE_TABLET | Freq: Once | ORAL | Status: DC

## 2022-04-29 MED ORDER — BUPIVACAINE-EPINEPHRINE (PF) 0.25% -1:200000 IJ SOLN
INTRAMUSCULAR | Status: AC
  Filled 2022-04-29: qty 30

## 2022-04-29 MED ORDER — LIDOCAINE HCL (PF) 2 % IJ SOLN
INTRAMUSCULAR | Status: DC | PRN
  Administered 2022-04-29: 1.5 mg/kg/h via INTRADERMAL

## 2022-04-29 MED ORDER — ORAL CARE MOUTH RINSE: 15.0000 mL | Freq: Once | OROMUCOSAL | Status: AC

## 2022-04-29 MED ORDER — ONDANSETRON HCL 4 MG/2ML IJ SOLN: 4.0000 mg | Freq: Once | INTRAMUSCULAR | Status: DC | PRN

## 2022-04-29 MED ORDER — FENTANYL CITRATE (PF) 100 MCG/2ML IJ SOLN
INTRAMUSCULAR | Status: AC
  Filled 2022-04-29: qty 2

## 2022-04-29 MED ORDER — TRAMADOL HCL 50 MG PO TABS: 50.0000 mg | ORAL_TABLET | Freq: Four times a day (QID) | ORAL | 0 refills | Status: AC | PRN

## 2022-04-29 MED ORDER — SODIUM CHLORIDE 0.9 % IR SOLN
Status: DC | PRN
  Administered 2022-04-29: 1000 mL

## 2022-04-29 MED ORDER — ROCURONIUM BROMIDE 10 MG/ML (PF) SYRINGE
PREFILLED_SYRINGE | INTRAVENOUS | Status: DC | PRN
  Administered 2022-04-29: 60 mg via INTRAVENOUS

## 2022-04-29 MED ORDER — PROPOFOL 10 MG/ML IV BOLUS
INTRAVENOUS | Status: DC | PRN
  Administered 2022-04-29: 100 mg via INTRAVENOUS

## 2022-04-29 MED ORDER — ONDANSETRON HCL 4 MG/2ML IJ SOLN
INTRAMUSCULAR | Status: DC | PRN
  Administered 2022-04-29: 4 mg via INTRAVENOUS

## 2022-04-29 MED ORDER — HYDROMORPHONE HCL 1 MG/ML IJ SOLN
0.2500 mg | INTRAMUSCULAR | Status: DC | PRN
  Administered 2022-04-29 (×2): 0.5 mg via INTRAVENOUS

## 2022-04-29 MED ORDER — ENSURE PRE-SURGERY PO LIQD
592.0000 mL | Freq: Once | ORAL | Status: DC
  Filled 2022-04-29: qty 592

## 2022-04-29 MED ORDER — OXYCODONE HCL 5 MG/5ML PO SOLN: 5.0000 mg | Freq: Once | ORAL | Status: DC | PRN

## 2022-04-29 MED ORDER — MIDAZOLAM HCL 5 MG/5ML IJ SOLN
INTRAMUSCULAR | Status: DC | PRN
  Administered 2022-04-29 (×2): 1 mg via INTRAVENOUS

## 2022-04-29 MED ORDER — LIDOCAINE HCL 2 % IJ SOLN
INTRAMUSCULAR | Status: AC
  Filled 2022-04-29: qty 20

## 2022-04-29 MED ORDER — OXYCODONE HCL 5 MG PO TABS: 5.0000 mg | ORAL_TABLET | Freq: Once | ORAL | Status: DC | PRN

## 2022-04-29 MED ORDER — PROPOFOL 10 MG/ML IV BOLUS
INTRAVENOUS | Status: AC
  Filled 2022-04-29: qty 20

## 2022-04-29 MED ORDER — SODIUM CHLORIDE 0.9 % IV SOLN
1.0000 g | INTRAVENOUS | Status: DC
  Filled 2022-04-29: qty 1

## 2022-04-29 MED ORDER — AMISULPRIDE (ANTIEMETIC) 5 MG/2ML IV SOLN: 10.0000 mg | Freq: Once | INTRAVENOUS | Status: DC | PRN

## 2022-04-29 SURGICAL SUPPLY — 50 items
APPLIER CLIP 5 13 M/L LIGAMAX5 (MISCELLANEOUS)
APPLIER CLIP ROT 10 11.4 M/L (STAPLE)
BAG COUNTER SPONGE SURGICOUNT (BAG) ×1 IMPLANT
CABLE HIGH FREQUENCY MONO STRZ (ELECTRODE) IMPLANT
CHLORAPREP W/TINT 26 (MISCELLANEOUS) ×1 IMPLANT
CLIP APPLIE 5 13 M/L LIGAMAX5 (MISCELLANEOUS) IMPLANT
CLIP APPLIE ROT 10 11.4 M/L (STAPLE) IMPLANT
COVER SURGICAL LIGHT HANDLE (MISCELLANEOUS) ×1 IMPLANT
CUTTER FLEX LINEAR 45M (STAPLE) ×1 IMPLANT
DERMABOND ADVANCED .7 DNX12 (GAUZE/BANDAGES/DRESSINGS) ×1 IMPLANT
DRAIN CHANNEL 19F RND (DRAIN) IMPLANT
ELECT PENCIL ROCKER SW 15FT (MISCELLANEOUS) ×1 IMPLANT
ELECT REM PT RETURN 15FT ADLT (MISCELLANEOUS) ×1 IMPLANT
ENDOLOOP SUT PDS II  0 18 (SUTURE)
ENDOLOOP SUT PDS II 0 18 (SUTURE) IMPLANT
EVACUATOR SILICONE 100CC (DRAIN) IMPLANT
GLOVE BIO SURGEON STRL SZ7.5 (GLOVE) ×1 IMPLANT
GLOVE INDICATOR 8.0 STRL GRN (GLOVE) ×1 IMPLANT
GOWN STRL REUS W/ TWL XL LVL3 (GOWN DISPOSABLE) ×2 IMPLANT
GOWN STRL REUS W/TWL XL LVL3 (GOWN DISPOSABLE) ×2
IRRIG SUCT STRYKERFLOW 2 WTIP (MISCELLANEOUS) ×1
IRRIGATION SUCT STRKRFLW 2 WTP (MISCELLANEOUS) ×1 IMPLANT
KIT BASIN OR (CUSTOM PROCEDURE TRAY) ×1 IMPLANT
KIT TURNOVER KIT A (KITS) IMPLANT
PAD POSITIONING PINK XL (MISCELLANEOUS) ×1 IMPLANT
RELOAD 45 VASCULAR/THIN (ENDOMECHANICALS) IMPLANT
RELOAD STAPLE 45 2.5 WHT GRN (ENDOMECHANICALS) IMPLANT
RELOAD STAPLE 45 3.5 BLU ETS (ENDOMECHANICALS) IMPLANT
RELOAD STAPLE 60 3.6 BLU REG (STAPLE) IMPLANT
RELOAD STAPLE TA45 3.5 REG BLU (ENDOMECHANICALS) IMPLANT
RELOAD STAPLER BLUE 60MM (STAPLE) ×1 IMPLANT
SCISSORS LAP 5X35 DISP (ENDOMECHANICALS) IMPLANT
SEALER TISSUE G2 STRG ARTC 35C (ENDOMECHANICALS) IMPLANT
SET TUBE SMOKE EVAC HIGH FLOW (TUBING) ×1 IMPLANT
SHEARS HARMONIC ACE PLUS 36CM (ENDOMECHANICALS) ×1 IMPLANT
SLEEVE ADV FIXATION 5X100MM (TROCAR) ×1 IMPLANT
SPIKE FLUID TRANSFER (MISCELLANEOUS) ×1 IMPLANT
STAPLER ECHELON LONG 60 440 (INSTRUMENTS) IMPLANT
STAPLER RELOAD BLUE 60MM (STAPLE) ×1
SUT ETHILON 3 0 PS 1 (SUTURE) IMPLANT
SUT MNCRL AB 4-0 PS2 18 (SUTURE) ×1 IMPLANT
SYS BAG RETRIEVAL 10MM (BASKET) ×2
SYSTEM BAG RETRIEVAL 10MM (BASKET) ×1 IMPLANT
TOWEL OR 17X26 10 PK STRL BLUE (TOWEL DISPOSABLE) IMPLANT
TOWEL OR NON WOVEN STRL DISP B (DISPOSABLE) ×1 IMPLANT
TRAY FOLEY MTR SLVR 14FR STAT (SET/KITS/TRAYS/PACK) ×1 IMPLANT
TRAY FOLEY MTR SLVR 16FR STAT (SET/KITS/TRAYS/PACK) ×1 IMPLANT
TRAY LAPAROSCOPIC (CUSTOM PROCEDURE TRAY) ×1 IMPLANT
TROCAR ADV FIXATION 5X100MM (TROCAR) ×1 IMPLANT
TROCAR BALLN 12MMX100 BLUNT (TROCAR) ×1 IMPLANT

## 2022-04-29 NOTE — Anesthesia Postprocedure Evaluation (Signed)
Anesthesia Post Note  Patient: Firefighter  Procedure(s) Performed: APPENDECTOMY LAPAROSCOPIC WITH PARTIAL CECECTOMY     Patient location during evaluation: PACU Anesthesia Type: General Level of consciousness: awake and alert, oriented and patient cooperative Pain management: pain level controlled Vital Signs Assessment: post-procedure vital signs reviewed and stable Respiratory status: spontaneous breathing, nonlabored ventilation and respiratory function stable Cardiovascular status: blood pressure returned to baseline and stable Postop Assessment: no apparent nausea or vomiting Anesthetic complications: no   No notable events documented.  Last Vitals:  Vitals:   04/29/22 1413 04/29/22 1415  BP:  (!) 127/59  Pulse: 64 63  Resp: 13 13  Temp:  36.5 C  SpO2: 100% 100%    Last Pain:  Vitals:   04/29/22 1400  TempSrc:   PainSc: [place]

## 2022-04-29 NOTE — Op Note (Signed)
Shannon Mendez [id number]   PRE-OPERATIVE DIAGNOSIS:  Appendiceal orifice polyp  POST-OPERATIVE DIAGNOSIS:  Same  PROCEDURE: Laparoscopic appendectomy with partial cecectomy  SURGEON:  Sharon Mt. Dema Severin, M.D.  ASSISTANT: Michael Boston MD  ANESTHESIA: General endotracheal  EBL:   5 mL  DRAINS: None  SPECIMEN:  Appendix with cuff of cecum  COUNTS:  Sponge, needle and instrument counts were reported correct x2 at conclusion of the operation  DISPOSITION:  PACU in satisfactory condition  COMPLICATIONS: None  FINDINGS: Appendix which is rather long with a long mesoappendix.  Appendectomy with an associated partial sick ectomy taking this to help ensure a negative margin.  We were able to preserve the ileocecal valve without any significant narrowing.  Specimen was opened on the back table demonstrating polypoid tissue at the appendiceal orifice and into the beginnings of the cecum but without any apparent involvement of the staple line.   DESCRIPTION:  The patient was identified & brought into the operating room. SCDs were in place and functioning. General endotracheal anesthesia was administered. Preoperative antibiotics were administered. The patient was positioned supine with left arm tucked. Hair on the abdomen was then clipped by the OR team. A foley catheter was inserted under sterile conditions. The abdomen was prepped and draped in the standard sterile fashion. A surgical timeout confirmed our plan.  A small incision was made in the infraumbilical fold. The subcutaneous tissue was dissected and the umbilical stalk identified. The stalk was grasped with a Kocher and retracted outwardly. The infraumbilical fascia was exposed and incised. Peritoneal entry was carefully made bluntly. A 0 Vicryl purse-string suture was placed and then the [facility] port was introduced into the abdomen.  CO2 insufflation commenced to 42mHg. The laparoscope was inserted and confirmed no evidence of trocar  site complications. The patient was then positioned in Trendelenburg. Two additional ports were placed -both in the left hemiabdomen under direct visualization. The bed was then slightly tilted to place the left side down.  The appendix is identified in the right lower quadrant and relatively elongated.  It is freed sharply from its attachments to the right lower quadrant sidewall and carefully swept free from the underlying retroperitoneal structures.  The right ureter and gonadal's are seen but swept down and protected free of injury.  There is no full-thickness extension of any sort of polyp or mass.  We then were able to identify the ileocecal valve.  The ileal sail is carefully taken down from its attachment from the cecum.  This is done sharply.  Hemostasis is verified.  The mesoappendix is then ligated and divided using the laparoscopic Enseal device.  The cut edge is inspected and noted to be hemostatic.  We are then able to carry out an appendectomy and included a cuff of associated cecum steering clear of the ileocecal valve.  This was done to ensure a negative margin.  We did this using the laparoscopic Echelon blue load stapler 60 mm with 2 firings to complete the transection.  The right lower quadrant was conservatively irrigated. Hemostasis was noted to be achieved - taking time to inspect the ligated mesoappendix, colon mesentery, and retroperitoneum. Staple line was noted to be intact on the cecum with no bleeding. There was no perforation or injury.  The specimen is then placed into an Endobag and removed from the umbilical port site.  I then went to a back table and opened up the specimen.  This by cutting the midportion of the appendix open and then opening  the associated appendiceal orifice as well as cecal cuff intentionally steering clear of the staple line so as not to affect the pathologic evaluation by our pathology team.  There is evident normal cecal mucosa abutting the staple line  and the polyp does appear to have been completely resected with this technique.  I then changed gowns and gloves after scrubbing back in.  The abdomen is reinspected and hemostasis is again appreciated.  The 5 mm trocars were removed under direct visualization and hemostatic. The CO2 was exhausted from the abdomen. The umbilical fascia was then closed by closing the 0 Vicryl suture. The fascia was palpated and noted to be completely closed. The skin of all port sites was then approximated using 4-0 Monocryl suture. The incisions were covered with Dermabond.  She was then awakened from general anesthesia, extubated, and transferred to a stretcher for transport to recover in satisfactory condition.

## 2022-04-29 NOTE — Transfer of Care (Signed)
Immediate Anesthesia Transfer of Care Note  Patient: Shannon Mendez  Procedure(s) Performed: Procedure(s): APPENDECTOMY LAPAROSCOPIC WITH PATIAL CECECTOMY (N/A)  Patient Location: PACU  Anesthesia Type:General  Level of Consciousness: Patient easily awoken, sedated, comfortable, cooperative, following commands, responds to stimulation.   Airway & Oxygen Therapy: Patient spontaneously breathing, ventilating well, oxygen via simple oxygen mask.  Post-op Assessment: Report given to PACU RN, vital signs reviewed and stable, moving all extremities.   Post vital signs: Reviewed and stable.  Complications: No apparent anesthesia complications  Last Vitals:  Vitals Value Taken Time  BP    Temp    Pulse 65 04/29/22 1332  Resp 12 04/29/22 1332  SpO2 100 % 04/29/22 1332  Vitals shown include unvalidated device data.  Last Pain:  Vitals:   04/29/22 1042  TempSrc: Oral         Complications: No notable events documented.

## 2022-04-29 NOTE — Anesthesia Preprocedure Evaluation (Addendum)
Anesthesia Evaluation  Patient identified by MRN, date of birth, ID band Patient awake    Reviewed: Allergy & Precautions, NPO status , Patient's Chart, lab work & pertinent test results  Airway Mallampati: II  TM Distance: >3 FB Neck ROM: Full    Dental  (+) Teeth Intact, Dental Advisory Given   Pulmonary  Snores at night, has never had sleep study    Pulmonary exam normal breath sounds clear to auscultation       Cardiovascular negative cardio ROS Normal cardiovascular exam Rhythm:Regular Rate:Normal     Neuro/Psych  Headaches, negative psych ROS   GI/Hepatic negative GI ROS, Neg liver ROS,   Endo/Other  Hypothyroidism   Renal/GU negative Renal ROS  negative genitourinary   Musculoskeletal  (+) Arthritis , Osteoarthritis,    Abdominal   Peds  Hematology negative hematology ROS (+)   Anesthesia Other Findings   Reproductive/Obstetrics negative OB ROS                            Anesthesia Physical Anesthesia Plan  ASA: 1  Anesthesia Plan: General   Post-op Pain Management: Tylenol PO (pre-op)*   Induction: Intravenous  PONV Risk Score and Plan: 3 and Ondansetron, Dexamethasone, Midazolam and Treatment may vary due to age or medical condition  Airway Management Planned: Oral ETT  Additional Equipment: None  Intra-op Plan:   Post-operative Plan: Extubation in OR  Informed Consent: I have reviewed the patients History and Physical, chart, labs and discussed the procedure including the risks, benefits and alternatives for the proposed anesthesia with the patient or authorized representative who has indicated his/her understanding and acceptance.     Dental advisory given  Plan Discussed with: CRNA  Anesthesia Plan Comments:         Anesthesia Quick Evaluation

## 2022-04-29 NOTE — Discharge Instructions (Signed)
POST OP INSTRUCTIONS  DIET: As tolerated. Follow a light bland diet the first 24 hours after arrival home, such as soup, liquids, crackers, etc.  Be sure to include lots of fluids daily.  Avoid fast food or heavy meals as your are more likely to get nauseated.  Eat a low fat the next few days after surgery.  Take your usually prescribed home medications unless otherwise directed.  PAIN CONTROL: Pain is best controlled by a usual combination of three different methods TOGETHER: Ice/Heat Over the counter pain medication Prescription pain medication Most patients will experience some swelling and bruising around the surgical site.  Ice packs or heating pads (30-60 minutes up to 6 times a day) will help. Some people prefer to use ice alone, heat alone, alternating between ice & heat.  Experiment to what works for you.  Swelling and bruising can take several weeks to resolve.   It is helpful to take an over-the-counter pain medication regularly for the first few weeks: Ibuprofen (Motrin/Advil) - 200mg tabs - take 3 tabs (600mg) every 6 hours as needed for pain Acetaminophen (Tylenol) - you may take 650mg every 6 hours as needed. You can take this with motrin as they act differently on the body. If you are taking a narcotic pain medication that has acetaminophen in it, do not take over the counter tylenol at the same time.  Iii. NOTE: You may take both of these medications together - most patients  find it most helpful when alternating between the two (i.e. Ibuprofen at 6am,  tylenol at 9am, ibuprofen at 12pm ...) A  prescription for pain medication should be given to you upon discharge.  Take your pain medication as prescribed if your pain is not adequatly controlled with the over-the-counter pain reliefs mentioned above.  Avoid getting constipated.  Between the surgery and the pain medications, it is common to experience some constipation.  Increasing fluid intake and taking a fiber supplement (such as  Metamucil, Citrucel, FiberCon, MiraLax, etc) 1-2 times a day regularly will usually help prevent this problem from occurring.  A mild laxative (prune juice, Milk of Magnesia, MiraLax, etc) should be taken according to package directions if there are no bowel movements after 48 hours.    Dressing: Your incision are covered in Dermabond which is like sterile superglue for the skin. This will come off on it's own in a couple weeks. It is waterproof and you may bathe normally starting the day after your surgery in a shower. Avoid baths/pools/lakes/oceans until your wounds have fully healed.  ACTIVITIES as tolerated:   Avoid heavy lifting (>10lbs or 1 gallon of milk) for the next 6 weeks. You may resume regular (light) daily activities beginning the next day--such as daily self-care, walking, climbing stairs--gradually increasing activities as tolerated.  If you can walk 30 minutes without difficulty, it is safe to try more intense activity such as jogging, treadmill, bicycling, low-impact aerobics.  DO NOT PUSH THROUGH PAIN.  Let pain be your guide: If it hurts to do something, don't do it. You may drive when you are no longer taking prescription pain medication, you can comfortably wear a seatbelt, and you can safely maneuver your car and apply brakes.   FOLLOW UP in our office Please call [facility] at [phone] to set up an appointment to see your surgeon in the office for a follow-up appointment approximately 2 weeks after your surgery. Make sure that you call for this appointment the day you arrive home to   insure a convenient appointment time.  9. If you have disability or family leave forms that need to be completed, you may have them completed by your primary care physician's office; for return to work instructions, please ask our office staff and they will be happy to assist you in obtaining this documentation   When to call us [phone]: Poor pain control Reactions / problems with new  medications (rash/itching, etc)  Fever over 101.5 F (38.5 C) Inability to urinate Nausea/vomiting Worsening swelling or bruising Continued bleeding from incision. Increased pain, redness, or drainage from the incision  The clinic staff is available to answer your questions during regular business hours (8:30am-5pm).  Please don't hesitate to call and ask to speak to one of our nurses for clinical concerns.   A surgeon from [facility] is always on call at the hospitals   If you have a medical emergency, go to the nearest emergency room or call 911.  [facility] [address], [address], [place], [place]  [address] MAIN: [phone] FAX: [phone] [email or web address]  

## 2022-04-29 NOTE — Anesthesia Procedure Notes (Signed)
Procedure Name: Intubation Date/Time: 04/29/2022 12:07 PM  Performed by: Deliah Boston, CRNAPre-anesthesia Checklist: Patient identified, Emergency Drugs available, Suction available and Patient being monitored Patient Re-evaluated:Patient Re-evaluated prior to induction Oxygen Delivery Method: Circle system utilized Preoxygenation: Pre-oxygenation with 100% oxygen Induction Type: IV induction Ventilation: Mask ventilation without difficulty Laryngoscope Size: Mac and 3 Grade View: Grade III Tube type: Oral Tube size: 7.0 mm Number of attempts: 1 Airway Equipment and Method: Stylet Placement Confirmation: ETT inserted through vocal cords under direct vision, positive ETCO2 and breath sounds checked- equal and bilateral Secured at: 21 cm Tube secured with: Tape Dental Injury: Teeth and Oropharynx as per pre-operative assessment

## 2022-04-29 NOTE — H&P (Signed)
CC: Here today for surgery  HPI: Shannon Mendez is an 71 y.o. female with history of hypothyroidism, migraines, whom was seen in the office 03/28/22 as a referral by Dr. Ardis Hughs for evaluation of polypoid appendiceal orifice.   Colonoscopy with Dr. Ardis Hughs 02/18/2022: 1. 2 sessile polyps removed 2. 1.5 cm nodular, polypoid lesion at appendiceal orifice. This was not clearly resectable endoscopically biopsies were taken. Some of the biopsy sites felt firm.  PATH: polyps - TA Appendiceal orifice polypoid lesion biopsy-tubular adenoma.  She denies any complaints today. She denies any abdominal pain, nausea, vomiting, bloating. She denies any longstanding history of constipation or diarrhea. She is here today with her husband.  PMH: Migraines, hypothyroidism  PSH: She denies any prior abdominal or pelvic surgical history or procedure  FHx: Denies any known family history of colorectal, breast, endometrial or ovarian cancer  Social Hx: Denies use of tobacco/EtOH/illicit drug. She is happily retired, previously worked for an [employer] firm in [place]. She is here today with her husband   Past Medical History:  Diagnosis Date   Arthritis    Fibrous papule of nose 08/08/2014   Benign   Goiter 08/15/2014   Heel spur, right    History of chicken pox    Hypothyroid    Migraine    Rheumatic fever    as a child   Vertigo    Vitreous detachment    2011    Past Surgical History:  Procedure Laterality Date   COLONOSCOPY  2009   in [place]-normal exam prep good   TONSILLECTOMY AND ADENOIDECTOMY      Family History  Problem Relation Age of Onset   Ovarian cancer Mother        in her 82s   Lung cancer Father        smoker   Aneurysm Father        Aorta A   Diabetes Other        GM ?   Colon cancer Neg Hx    Cervical cancer Neg Hx    Breast cancer Neg Hx    CAD Neg Hx     Social:  reports that she has never smoked. She has never used smokeless tobacco. She reports current  alcohol use. She reports that she does not use drugs.  Allergies:  Allergies  Allergen Reactions   Penicillins Hives   Amoxicillin Hives    Medications: I have reviewed the patient's current medications.  No results found for this or any previous visit (from the past 48 hour(s)).  No results found.  ROS - all of the below systems have been reviewed with the patient and positives are indicated with bold text General: chills, fever or night sweats Eyes: blurry vision or double vision ENT: epistaxis or sore throat Allergy/Immunology: itchy/watery eyes or nasal congestion Hematologic/Lymphatic: bleeding problems, blood clots or swollen lymph nodes Endocrine: temperature intolerance or unexpected weight changes Breast: new or changing breast lumps or nipple discharge Resp: cough, shortness of breath, or wheezing CV: chest pain or dyspnea on exertion GI: as per HPI GU: dysuria, trouble voiding, or hematuria MSK: joint pain or joint stiffness Neuro: TIA or stroke symptoms Derm: pruritus and skin lesion changes Psych: anxiety and depression  PE Blood pressure (!) 152/72, pulse 73, temperature 98.4 F (36.9 C), temperature source Oral, resp. rate 16, SpO2 99 %. Constitutional: NAD; conversant Eyes: Moist conjunctiva; Lungs: Normal respiratory effort CV: RRR; no palpable thrills; no pitting edema GI: Abd soft,  NT/ND MSK: Normal range of motion of extremities Psychiatric: Appropriate affect; alert and oriented x3  No results found for this or any previous visit (from the past 48 hour(s)).  No results found.   A/P: Shannon Mendez is an 71 y.o. female with hx of migraines, hypothyroidism here for evaluation of appendiceal orifice polyp, endoscopically not resectable - pathology demonstrated tubular adenoma  -The anatomy and physiology of the GI tract was reviewed with the patient. The pathophysiology of colon polyps was discussed again as well today -We have discussed various  different treatment options going forward including surgery (the most definitive) to address this - laparoscopic appendectomy, possible ileocecectomy/right hemicolectomy in unable to be completely excised with appendectomy. Discussed technical aspects were we would work to remove appendix with cuff of cecum in order to work towards a negative mucosal margin... but if unable to do so, proceeding with segmental colectomy. -The planned procedure, material risks (including, but not limited to, pain, bleeding, infection, scarring, need for blood transfusion, damage to surrounding structures- blood vessels/nerves/viscus/organs, damage to ureter, urine leak, leak from anastomosis, need for additional procedures, scenarios where a stoma may be necessary and where it may be permanent, worsening of pre-existing medical conditions, hernia, recurrence, pneumonia, heart attack, stroke, death) benefits and alternatives to surgery were discussed at length. The patient's questions were answered to her satisfaction, she voiced understanding and elected to proceed with surgery. Additionally, we discussed typical postoperative expectations and the recovery process.   Nadeen Landau, [facility], [place]

## 2022-04-30 ENCOUNTER — Encounter (HOSPITAL_COMMUNITY): Payer: Self-pay | Admitting: Surgery

## 2022-05-02 LAB — SURGICAL PATHOLOGY

## 2022-05-30 ENCOUNTER — Other Ambulatory Visit: Payer: Self-pay | Admitting: Internal Medicine

## 2022-05-30 MED ORDER — LEVOTHYROXINE SODIUM 50 MCG PO TABS: ORAL_TABLET | ORAL | 1 refills | Status: DC

## 2022-06-14 ENCOUNTER — Ambulatory Visit (INDEPENDENT_AMBULATORY_CARE_PROVIDER_SITE_OTHER): Payer: Medicare HMO

## 2022-06-14 DIAGNOSIS — Z0000 Encounter for general adult medical examination without abnormal findings: Secondary | ICD-10-CM

## 2022-06-14 NOTE — Patient Instructions (Signed)
Ms. Shannon Mendez , Thank you for taking time to come for your Medicare Wellness Visit. I appreciate your ongoing commitment to your health goals. Please review the following plan we discussed and let me know if I can assist you in the future.   These are the goals we discussed:  Goals      Patient Stated     Ea healthier, drink more water & increase activity     Patient Stated     Continue exercise         This is a list of the screening recommended for you and due dates:  Health Maintenance  Topic Date Due   COVID-19 Vaccine (5 - Pfizer risk series) 07/27/2021   Flu Shot  03/08/2022   Pap Smear  10/22/2022   Mammogram  11/25/2022   Medicare Annual Wellness Visit  06/15/2023   Tetanus Vaccine  01/19/2025   Colon Cancer Screening  02/18/2029   Pneumonia Vaccine  Completed   DEXA scan (bone density measurement)  Completed   Hepatitis C Screening: USPSTF Recommendation to screen - Ages 76-79 yo.  Completed   HPV Vaccine  Aged Out   Zoster (Shingles) Vaccine  Discontinued    Advanced directives: Please bring a copy of your health care power of attorney and living will to the office at your convenience.  Conditions/risks identified: continue to exercise   Next appointment: Follow up in one year for your annual wellness visit    Preventive Care 65 Years and Older, Female Preventive care refers to lifestyle choices and visits with your health care provider that can promote health and wellness. What does preventive care include? A yearly physical exam. This is also called an annual well check. Dental exams once or twice a year. Routine eye exams. Ask your health care provider how often you should have your eyes checked. Personal lifestyle choices, including: Daily care of your teeth and gums. Regular physical activity. Eating a healthy diet. Avoiding tobacco and drug use. Limiting alcohol use. Practicing safe sex. Taking low-dose aspirin every day. Taking vitamin and mineral  supplements as recommended by your health care provider. What happens during an annual well check? The services and screenings done by your health care provider during your annual well check will depend on your age, overall health, lifestyle risk factors, and family history of disease. Counseling  Your health care provider may ask you questions about your: Alcohol use. Tobacco use. Drug use. Emotional well-being. Home and relationship well-being. Sexual activity. Eating habits. History of falls. Memory and ability to understand (cognition). Work and work Statistician. Reproductive health. Screening  You may have the following tests or measurements: Height, weight, and BMI. Blood pressure. Lipid and cholesterol levels. These may be checked every 5 years, or more frequently if you are over 65 years old. Skin check. Lung cancer screening. You may have this screening every year starting at age 62 if you have a 30-pack-year history of smoking and currently smoke or have quit within the past 15 years. Fecal occult blood test (FOBT) of the stool. You may have this test every year starting at age 39. Flexible sigmoidoscopy or colonoscopy. You may have a sigmoidoscopy every 5 years or a colonoscopy every 10 years starting at age 20. Hepatitis C blood test. Hepatitis B blood test. Sexually transmitted disease (STD) testing. Diabetes screening. This is done by checking your blood sugar (glucose) after you have not eaten for a while (fasting). You may have this done every 1-3 years. Bone  density scan. This is done to screen for osteoporosis. You may have this done starting at age 63. Mammogram. This may be done every 1-2 years. Talk to your health care provider about how often you should have regular mammograms. Talk with your health care provider about your test results, treatment options, and if necessary, the need for more tests. Vaccines  Your health care provider may recommend certain  vaccines, such as: Influenza vaccine. This is recommended every year. Tetanus, diphtheria, and acellular pertussis (Tdap, Td) vaccine. You may need a Td booster every 10 years. Zoster vaccine. You may need this after age 5. Pneumococcal 13-valent conjugate (PCV13) vaccine. One dose is recommended after age 33. Pneumococcal polysaccharide (PPSV23) vaccine. One dose is recommended after age 60. Talk to your health care provider about which screenings and vaccines you need and how often you need them. This information is not intended to replace advice given to you by your health care provider. Make sure you discuss any questions you have with your health care provider. Document Released: 08/21/2015 Document Revised: 04/13/2016 Document Reviewed: 05/26/2015 Elsevier Interactive Patient Education  2017 [place] Prevention in the Home Falls can cause injuries. They can happen to people of all ages. There are many things you can do to make your home safe and to help prevent falls. What can I do on the outside of my home? Regularly fix the edges of walkways and driveways and fix any cracks. Remove anything that might make you trip as you walk through a door, such as a raised step or threshold. Trim any bushes or trees on the path to your home. Use bright outdoor lighting. Clear any walking paths of anything that might make someone trip, such as rocks or tools. Regularly check to see if handrails are loose or broken. Make sure that both sides of any steps have handrails. Any raised decks and porches should have guardrails on the edges. Have any leaves, snow, or ice cleared regularly. Use sand or salt on walking paths during winter. Clean up any spills in your garage right away. This includes oil or grease spills. What can I do in the bathroom? Use night lights. Install grab bars by the toilet and in the tub and shower. Do not use towel bars as grab bars. Use non-skid mats or decals in  the tub or shower. If you need to sit down in the shower, use a plastic, non-slip stool. Keep the floor dry. Clean up any water that spills on the floor as soon as it happens. Remove soap buildup in the tub or shower regularly. Attach bath mats securely with double-sided non-slip rug tape. Do not have throw rugs and other things on the floor that can make you trip. What can I do in the bedroom? Use night lights. Make sure that you have a light by your bed that is easy to reach. Do not use any sheets or blankets that are too big for your bed. They should not hang down onto the floor. Have a firm chair that has side arms. You can use this for support while you get dressed. Do not have throw rugs and other things on the floor that can make you trip. What can I do in the kitchen? Clean up any spills right away. Avoid walking on wet floors. Keep items that you use a lot in easy-to-reach places. If you need to reach something above you, use a strong step stool that has a grab bar. Keep  electrical cords out of the way. Do not use floor polish or wax that makes floors slippery. If you must use wax, use non-skid floor wax. Do not have throw rugs and other things on the floor that can make you trip. What can I do with my stairs? Do not leave any items on the stairs. Make sure that there are handrails on both sides of the stairs and use them. Fix handrails that are broken or loose. Make sure that handrails are as long as the stairways. Check any carpeting to make sure that it is firmly attached to the stairs. Fix any carpet that is loose or worn. Avoid having throw rugs at the top or bottom of the stairs. If you do have throw rugs, attach them to the floor with carpet tape. Make sure that you have a light switch at the top of the stairs and the bottom of the stairs. If you do not have them, ask someone to add them for you. What else can I do to help prevent falls? Wear shoes that: Do not have high  heels. Have rubber bottoms. Are comfortable and fit you well. Are closed at the toe. Do not wear sandals. If you use a stepladder: Make sure that it is fully opened. Do not climb a closed stepladder. Make sure that both sides of the stepladder are locked into place. Ask someone to hold it for you, if possible. Clearly mark and make sure that you can see: Any grab bars or handrails. First and last steps. Where the edge of each step is. Use tools that help you move around (mobility aids) if they are needed. These include: Canes. Walkers. Scooters. Crutches. Turn on the lights when you go into a dark area. Replace any light bulbs as soon as they burn out. Set up your furniture so you have a clear path. Avoid moving your furniture around. If any of your floors are uneven, fix them. If there are any pets around you, be aware of where they are. Review your medicines with your doctor. Some medicines can make you feel dizzy. This can increase your chance of falling. Ask your doctor what other things that you can do to help prevent falls. This information is not intended to replace advice given to you by your health care provider. Make sure you discuss any questions you have with your health care provider. Document Released: 05/21/2009 Document Revised: 12/31/2015 Document Reviewed: 08/29/2014 Elsevier Interactive Patient Education  2017 [employer].

## 2022-06-14 NOTE — Progress Notes (Addendum)
I connected with  Rudi Heap on 06/14/22 by a audio enabled telemedicine application and verified that I am speaking with the correct person using two identifiers.  Patient Location: Home  Provider Location: Office/Clinic  I discussed the limitations of evaluation and management by telemedicine. The patient expressed understanding and agreed to proceed.   Subjective:   Shannon Mendez is a 71 y.o. female who presents for Medicare Annual (Subsequent) preventive examination.  Review of Systems     Cardiac Risk Factors include: advanced age (>13mn, >>43women)     Objective:    There were no vitals filed for this visit. There is no height or weight on file to calculate BMI.     06/14/2022    2:12 PM 04/29/2022   11:10 AM 04/19/2022    2:01 PM 04/08/2021    2:30 PM  Advanced Directives  Does Patient Have a Medical Advance Directive? Yes No No No  Type of [place]amed[place]of [place] will     Copy of [place] Chart? No - copy requested     Would patient like information on creating a medical advance directive?  No - Patient declined  Yes (MAU/Ambulatory/Procedural Areas - Information given)    Current Medications (verified) Outpatient Encounter Medications as of 06/14/2022  Medication Sig   acetaminophen (TYLENOL) 500 MG tablet Take 500 mg by mouth every 6 (six) hours as needed for moderate pain.   Cholecalciferol (VITAMIN D3) 50 MCG (2000 UT) capsule Take 4,000 Units by mouth daily.   gentamicin ointment (GARAMYCIN) 0.1 % 1 Application daily as needed (nose bleeds).   levothyroxine (SYNTHROID) 50 MCG tablet 50 MCG MONDAY-WEDNESDAY-FRIDAY   levothyroxine (SYNTHROID) 75 MCG tablet TAKE 75MCG ON T, TH, SAT, AND SUNDAY   meclizine (ANTIVERT) 25 MG tablet Take 1 tablet (25 mg total) by mouth at bedtime as needed for dizziness.   [DISCONTINUED] SUMAtriptan (IMITREX) 50 MG tablet Take 1 tablet (50 mg total) by mouth every 2 (two) hours as  needed for migraine. No more than 2 pills in a 24 hour period   No facility-administered encounter medications on file as of 06/14/2022.    Allergies (verified) Penicillins and Amoxicillin   History: Past Medical History:  Diagnosis Date   Arthritis    Fibrous papule of nose 08/08/2014   Benign   Goiter 08/15/2014   Heel spur, right    History of chicken pox    Hypothyroid    Migraine    Rheumatic fever    as a child   Vertigo    Vitreous detachment    2011   Past Surgical History:  Procedure Laterality Date   COLONOSCOPY  2009   [place]   LAPAROSCOPIC APPENDECTOMY N/A 04/29/2022   Procedure: APPENDECTOMY LAPAROSCOPIC WITH PARTIAL CECECTOMY;  Surgeon: WIleana Roup MD;  Location: [facility];  Service: General;  Laterality: N/A;   TONSILLECTOMY AND ADENOIDECTOMY     Family History  Problem Relation Age of Onset   Ovarian cancer Mother        in her 732s  Lung cancer Father        smoker   Aneurysm Father        Aorta A   Diabetes Other        GM ?   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, minerals  Tobacco Use   Smoking status: Never   Smokeless tobacco: Never  Vaping Use   Vaping Use: Never used  Substance and Sexual Activity   Alcohol use: Yes    Comment: Occasional-wine   Drug use: No   Sexual activity: Not on file  Other Topics Concern   Not on file  Social History Narrative   Moved from [place] 2014   Has a son ([place]) and a  daughter (she lives in [place]) they were born in the 71s       Social Determinants of Health   Financial Resource Strain: [place]  (06/14/2022)   Overall Financial  Resource Strain (CARDIA)    Difficulty of Paying Living Expenses: Not hard at all  Food Insecurity: No [place] (06/14/2022)   Hunger Vital Sign    Worried About Running Out of Food in the Last Year: Never true    [place] in the Last Year: Never true  Transportation Needs: No Transportation Needs (06/14/2022)   PRAPARE - Hydrologist (Medical): No    Lack of Transportation (Non-Medical): No  Physical Activity: Insufficiently Active (06/14/2022)   Exercise Vital Sign    Days of Exercise per Week: 5 days    Minutes of Exercise per Session: 20 min  Stress: No Stress Concern Present (06/14/2022)   [place]    Feeling of Stress : Not at all  Social Connections: Moderately Isolated (06/14/2022)   Social Connection and Isolation Panel [NHANES]    Frequency of Communication with Friends and Family: More than three times a week    Frequency of Social Gatherings with Friends and Family: More than three times a week    Attends Religious Services: Never    Marine scientist or Organizations: No    Attends Music therapist: Never    Marital Status: Married    Tobacco Counseling Counseling given: Not Answered   Clinical Intake:  Pre-visit preparation completed: Yes  Pain : No/denies pain     Nutritional Risks: None Diabetes: No  How often do you need to have someone help you when you read instructions, pamphlets, or other written materials from your doctor or pharmacy?: 1 - Never  Diabetic?no  Interpreter Needed?: No  Information entered by :: Charlott Rakes, LPN   Activities of Daily Living    06/14/2022    2:13 PM 04/19/2022    2:02 PM  In your present state of health, do you have any difficulty performing the following activities:  Hearing? 0   Vision? 0   Difficulty concentrating or making decisions? 0   Walking or climbing stairs? 0   Dressing  or bathing? 0   Doing errands, shopping? 0 0  Preparing Food and eating ? N   Using the Toilet? N   In the past six months, have you accidently leaked urine? N   Do you have problems with loss of bowel control? N   Managing your Medications? N   Managing your Finances? N   Housekeeping or managing your Housekeeping? N  Patient Care Team: Colon Branch, MD as PCP - General (Internal Medicine) Silvestre Gunner, Anderson Malta, PA-C (Inactive) as Physician Assistant (Dermatology) Linda Hedges, DO as Consulting Physician (Obstetrics and Gynecology)  Indicate any recent Medical Services you may have received from other than [facility] providers in the past year (date may be approximate).     Assessment:   This is a routine wellness examination for [place].  Hearing/Vision screen Hearing Screening - Comments:: Pt denies any hearing issue  Vision Screening - Comments:: Pt follows up with [facility] for annual eye exams   Dietary issues and exercise activities discussed: Current Exercise Habits: Home exercise routine, Type of exercise: walking, Time (Minutes): 20, Frequency (Times/Week): 5, Weekly Exercise (Minutes/Week): 100   Goals Addressed             This Visit's Progress    Patient Stated       Continue exercise        Depression Screen    06/14/2022    2:11 PM 02/01/2022   10:44 AM 10/04/2021    1:02 PM 08/13/2021    9:04 AM 04/08/2021    2:32 PM 03/23/2021   10:00 AM 10/01/2020    9:50 AM  PHQ 2/9 Scores  PHQ - 2 Score 0 0 0 0 0 0 0    Fall Risk    06/14/2022    2:13 PM 02/01/2022   10:44 AM 10/04/2021    1:02 PM 08/13/2021    9:05 AM 04/08/2021    2:31 PM  [place] in the past year? 0 0 0 0 0  Number falls in past yr: 0 0 0 0 0  Injury with Fall? 0 0 0 0 0  Risk for fall due to : Impaired vision      Risk for fall due to: Comment balance when vertigo comes on      Follow up Falls prevention discussed Falls evaluation completed Falls evaluation completed Falls  evaluation completed Falls prevention discussed    FALL RISK PREVENTION PERTAINING TO THE HOME:  Any stairs in or around the home? Yes  If so, are there any without handrails? No  Home free of loose throw rugs in walkways, pet beds, electrical cords, etc? Yes  Adequate lighting in your home to reduce risk of falls? Yes   ASSISTIVE DEVICES UTILIZED TO PREVENT FALLS:  Life alert? No  Use of a cane, walker or w/c? No  Grab bars in the bathroom? Yes  Shower chair or bench in shower? Yes  Elevated toilet seat or a handicapped toilet? No   TIMED UP AND GO:  Was the test performed? No .   Cognitive Function:        06/14/2022    2:15 PM  6CIT Screen  What Year? 0 points  What month? 0 points  What time? 0 points  Count back from 20 0 points  Months in reverse 0 points  Repeat phrase 0 points  Total Score 0 points    Immunizations Immunization History  Administered Date(s) Administered   Fluad Quad(high Dose 65+) 05/23/2019, 07/20/2020   PFIZER(Purple Top)SARS-COV-2 Vaccination 10/03/2019, 10/23/2019, 06/15/2020   Pfizer Covid-19 Vaccine Bivalent Booster 8yr & up 06/01/2021   Pneumococcal Conjugate-13 10/01/2020   Pneumococcal Polysaccharide-23 06/25/2019   Tdap 01/20/2015    TDAP status: Up to date  Flu Vaccine status: Due, Education has been provided regarding the importance of this vaccine. Advised may receive this vaccine at local pharmacy or Health Dept. Aware  to provide a copy of the vaccination record if obtained from local pharmacy or Health Dept. Verbalized acceptance and understanding.  Pneumococcal vaccine status: Up to date  Covid-19 vaccine status: Completed vaccines  Qualifies for Shingles Vaccine? No   Zostavax completed No   Shingrix Completed?: No.    Education has been provided regarding the importance of this vaccine. Patient has been advised to call insurance company to determine out of pocket expense if they have not yet received this vaccine.  Advised may also receive vaccine at local pharmacy or Health Dept. Verbalized acceptance and understanding.  Screening Tests Health Maintenance  Topic Date Due   COVID-19 Vaccine (5 - Pfizer risk series) 07/27/2021   INFLUENZA VACCINE  03/08/2022   PAP SMEAR-Modifier  10/22/2022   MAMMOGRAM  11/25/2022   Medicare Annual Wellness (AWV)  06/15/2023   TETANUS/TDAP  01/19/2025   COLONOSCOPY (Pts 45-63yr Insurance coverage will need to be confirmed)  02/18/2029   Pneumonia Vaccine 70 Years old  Completed   DEXA SCAN  Completed   Hepatitis C Screening  Completed   HPV VACCINES  Aged Out   Zoster Vaccines- Shingrix  Discontinued    Health Maintenance  Health Maintenance Due  Topic Date Due   COVID-19 Vaccine (5 - Pfizer risk series) 07/27/2021   INFLUENZA VACCINE  03/08/2022    Colorectal cancer screening: Type of screening: Colonoscopy. Completed 02/18/22. Repeat every 7 years  Mammogram status: Completed 11/24/21. Repeat every year  Bone Density status: Completed 10/11/21. Results reflect: Bone density results: OSTEOPENIA. Repeat every 2 years.   Additional Screening:  Hepatitis C Screening:  Completed 08/18/17  Vision Screening: Recommended annual ophthalmology exams for early detection of glaucoma and other disorders of the eye. Is the patient up to date with their annual eye exam?  Yes  Who is the provider or what is the name of the office in which the patient attends annual eye exams? [facility]  If pt is not established with a provider, would they like to be referred to a provider to establish care? No .   Dental Screening: Recommended annual dental exams for proper oral hygiene  Community Resource Referral / Chronic Care Management: CRR required this visit?  No   CCM required this visit?  No      Plan:     I have personally reviewed and noted the following in the patient's chart:   Medical and social history Use of alcohol, tobacco or illicit drugs  Current  medications and supplements including opioid prescriptions. Patient is not currently taking opioid prescriptions. Functional ability and status Nutritional status Physical activity Advanced directives List of other physicians Hospitalizations, surgeries, and ER visits in previous 12 months Vitals Screenings to include cognitive, depression, and falls Referrals and appointments  In addition, I have reviewed and discussed with patient certain preventive protocols, quality metrics, and best practice recommendations. A written personalized care plan for preventive services as well as general preventive health recommendations were provided to patient.     TWillette Brace LPN   [id number]  Nurse Notes: none    I have reviewed and agree with [facility] documentation.  JKathlene November MD

## 2022-07-18 DIAGNOSIS — Z1159 Encounter for screening for other viral diseases: Secondary | ICD-10-CM | POA: Diagnosis not present

## 2022-08-26 IMAGING — MR MR HEAD W/O CM
10 series · 48 of 48 positions shown · non-contrast
Comparison: None.

CLINICAL DATA: Vertigo R42 ([id number]).  Dizziness, non-specific.

EXAM:
MRI HEAD WITHOUT CONTRAST
TECHNIQUE: Multiplanar, multiecho pulse sequences of the brain and surrounding
structures were obtained without intravenous contrast.

[Series 2: DWI · axial · 3.0mm · 1.15mm/px · z∈[-54,+106]mm · 9 of 107 slices shown (1 of 4)]
[im 1/107]
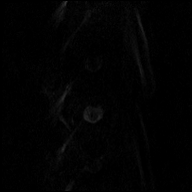
[im 14/107]
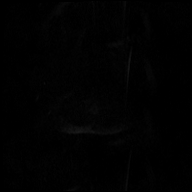
[im 27/107]
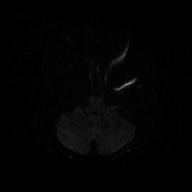
[im 40/107]
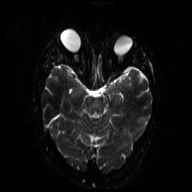
[im 54/107]
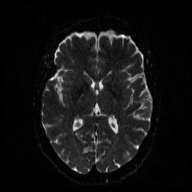
[im 67/107]
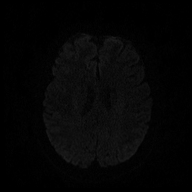
[im 80/107]
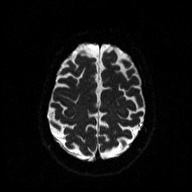
[im 93/107]
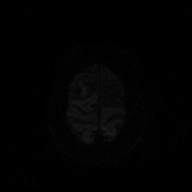
[im 107/107]
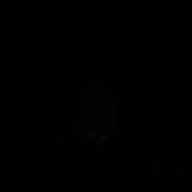

[Series 3: DWI · axial · 3.0mm · 1.15mm/px · z∈[-54,+106]mm · 4 of 55 slices shown (2 of 4)]
[im 1/55]
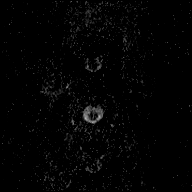
[im 19/55]
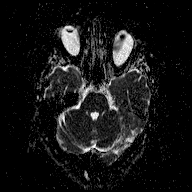
[im 37/55]
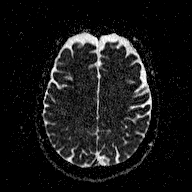
[im 55/55]
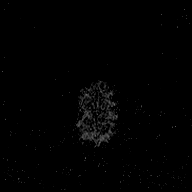

[Series 4: DWI · coronal · 5.0mm · 1.25mm/px · 5 of 62 slices shown (3 of 4)]
[im 1/62]
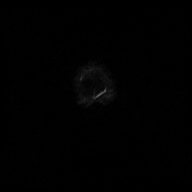
[im 16/62]
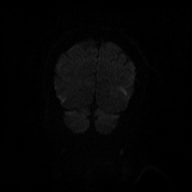
[im 31/62]
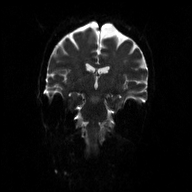
[im 46/62]
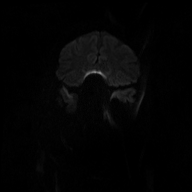
[im 62/62]
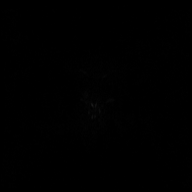

[Series 5: DWI · coronal · 5.0mm · 1.25mm/px · 3 of 33 slices shown (4 of 4)]
[im 1/33]
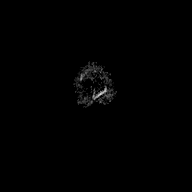
[im 17/33]
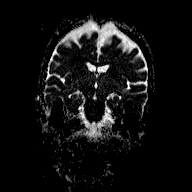
[im 33/33]
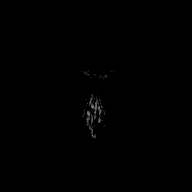

[Series 6: T1 · sagittal · 5.0mm · 0.45mm/px · 2 of 23 slices shown (1 of 2)]
[im 1/23]
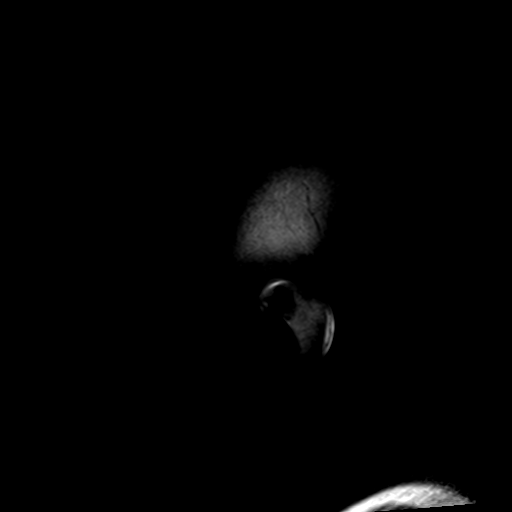
[im 23/23]
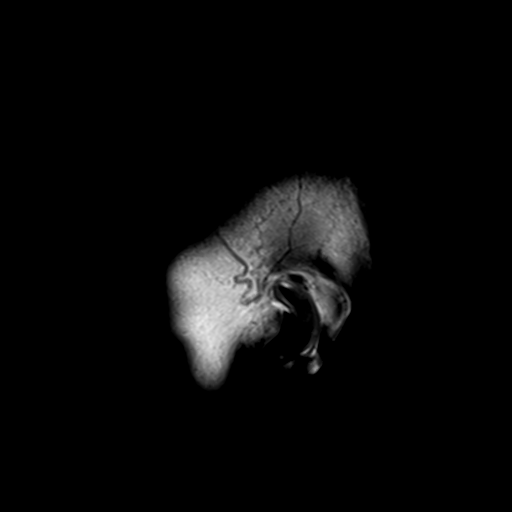

[Series 7: T2 · axial · 5.0mm · 0.72mm/px · z∈[-49,+103]mm · 2 of 23 slices shown (1 of 3)]
[im 1/23]
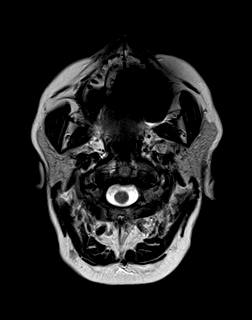
[im 23/23]
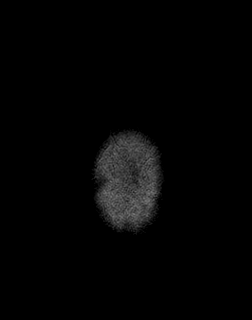

[Series 8: FLAIR · axial · 3.0mm · 0.45mm/px · z∈[-53,+107]mm · 4 of 55 slices shown]
[im 1/55]
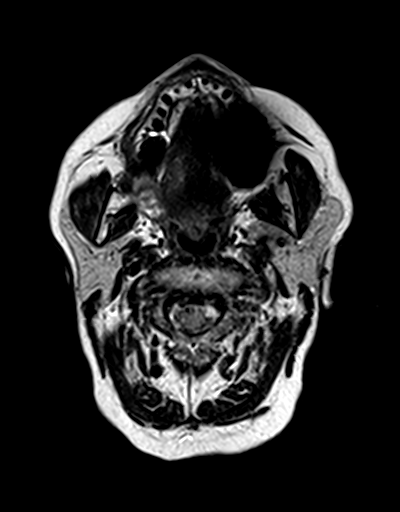
[im 19/55]
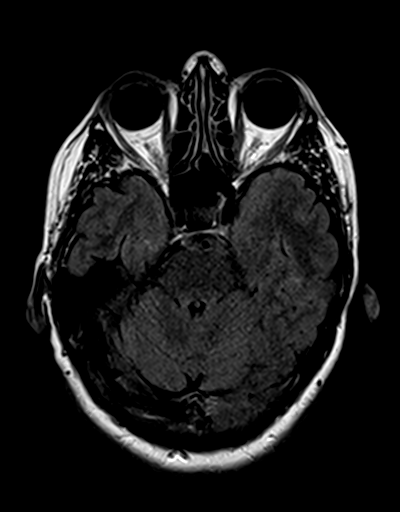
[im 37/55]
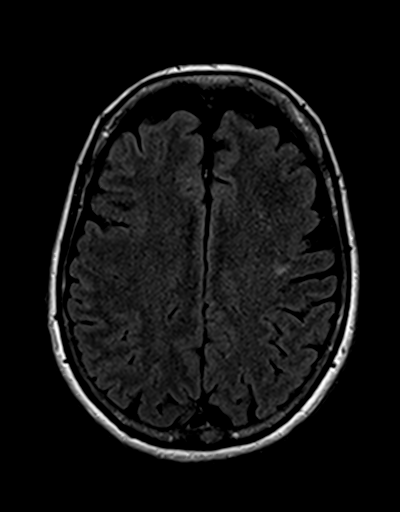
[im 55/55]
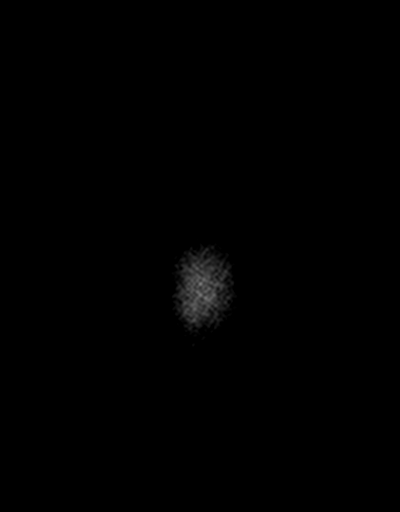

[Series 9: T2 · axial · 5.0mm · 0.72mm/px · z∈[-49,+103]mm · 2 of 23 slices shown (2 of 3)]
[im 1/23]
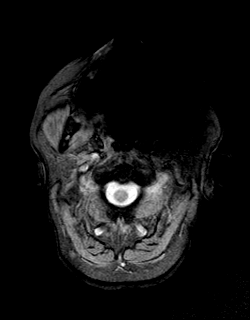
[im 23/23]
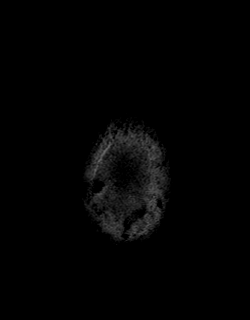

[Series 10: T1 · axial · 1.0mm · 0.90mm/px · z∈[-59,+112]mm · 14 of 175 slices shown (2 of 2)]
[im 1/175]
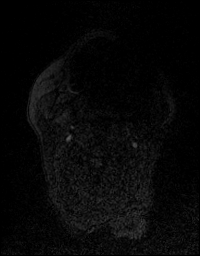
[im 14/175]
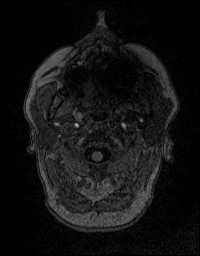
[im 27/175]
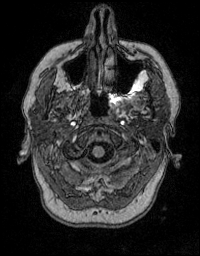
[im 41/175]
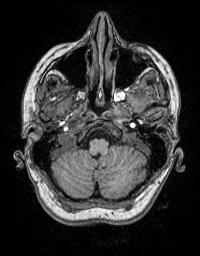
[im 54/175]
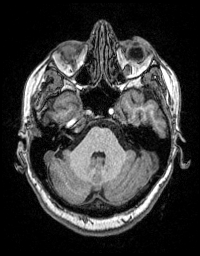
[im 67/175]
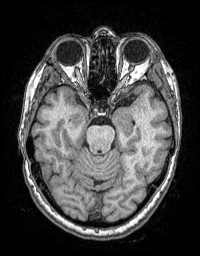
[im 81/175]
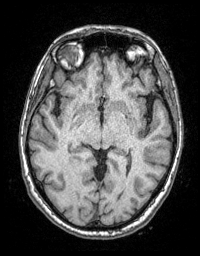
[im 94/175]
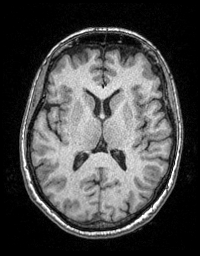
[im 108/175]
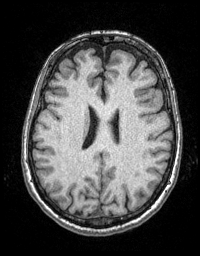
[im 121/175]
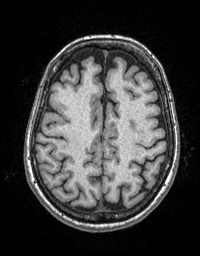
[im 134/175]
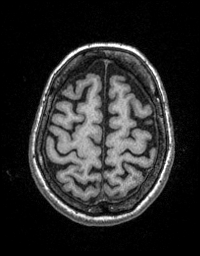
[im 148/175]
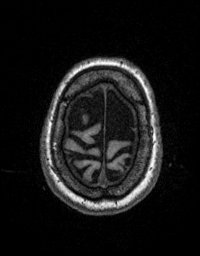
[im 161/175]
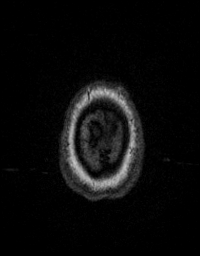
[im 175/175]
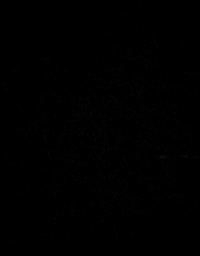

[Series 11: T2 · coronal · 5.0mm · 0.43mm/px · 3 of 31 slices shown (3 of 3)]
[im 1/31]
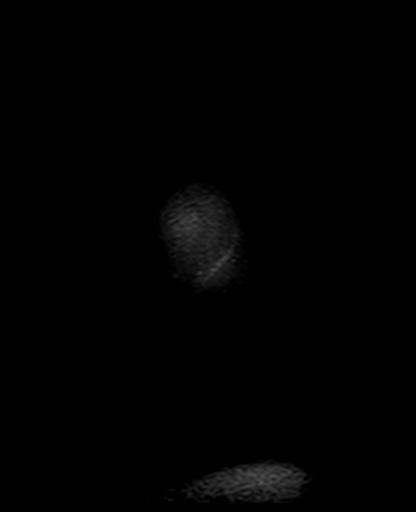
[im 16/31]
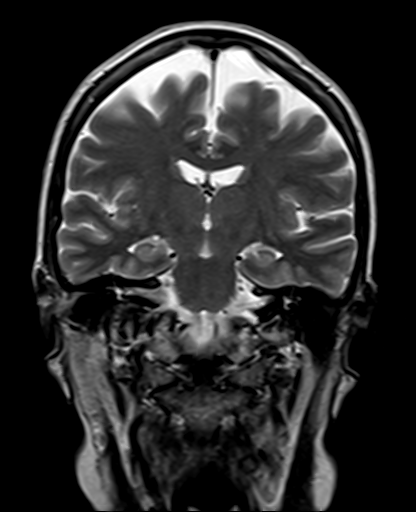
[im 31/31]
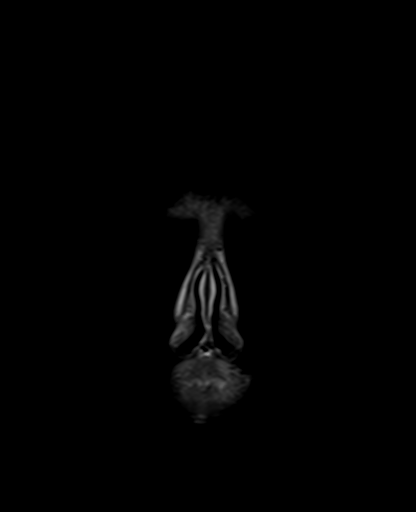

[48 of 48 positions shown; findings below may reference images not displayed]

FINDINGS: Brain: No acute infarction, hemorrhage, hydrocephalus, extra-axial
collection or mass lesion. A few foci of T2 hyperintensity within
the white matter of the cerebral hemispheres, nonspecific, most
likely related to early chronic microangiopathy.

Vascular: Normal flow voids.

Skull and upper cervical spine: Normal marrow signal.

Sinuses/Orbits: Trace mucosal thickening of the ethmoid cells. The
orbits are maintained.

Other: None.
IMPRESSION: Minimal amount of nonspecific T2 hyperintense lesions of the white
matter, may represent early chronic microangiopathy.

## 2022-09-13 ENCOUNTER — Encounter: Payer: Self-pay | Admitting: Internal Medicine

## 2022-09-13 ENCOUNTER — Ambulatory Visit (INDEPENDENT_AMBULATORY_CARE_PROVIDER_SITE_OTHER): Payer: Medicare HMO | Admitting: Internal Medicine

## 2022-09-13 VITALS — BP 124/64 | HR 56 | Temp 98.2°F | Resp 16 | Ht 64.0 in | Wt 148.5 lb

## 2022-09-13 DIAGNOSIS — L989 Disorder of the skin and subcutaneous tissue, unspecified: Secondary | ICD-10-CM | POA: Diagnosis not present

## 2022-09-13 DIAGNOSIS — D126 Benign neoplasm of colon, unspecified: Secondary | ICD-10-CM

## 2022-09-13 NOTE — Assessment & Plan Note (Signed)
SK: Has multiple skin lesions, she is concerned about one on the right shoulder that has recently change.  Possibly a SK but needs to see dermatology, referral sent. Colon polyps: Colonoscopy 02-2022: 2 polyps, one was close to the appendiceal orifice, bx: Tubular adenoma, was referred to surgery, had an appendectomy September 2023, polyp was removed: Tubulovillous adenoma.  Other than mild incisional discomfort she feels very well.

## 2022-09-13 NOTE — Progress Notes (Signed)
   Subjective:    Patient ID: Shannon Mendez, female    DOB: 01-Apr-1951, 72 y.o.   MRN: [id number]  DOS:  09/13/2022 Type of visit - description: acute  She has a number of skin lesions, 2 days ago noted that one of the lesions at the right shoulder has a darker spot on it. She denies any bleeding or ulcers. Also, recently had an appendectomy, chart reviewed.   Review of Systems See above   Past Medical History:  Diagnosis Date   Arthritis    Fibrous papule of nose 08/08/2014   Benign   Goiter 08/15/2014   Heel spur, right    History of chicken pox    Hypothyroid    Migraine    Rheumatic fever    as a child   Vertigo    Vitreous detachment    2011    Past Surgical History:  Procedure Laterality Date   COLONOSCOPY  2009   in [place] exam prep good   LAPAROSCOPIC APPENDECTOMY N/A 04/29/2022   Procedure: APPENDECTOMY LAPAROSCOPIC WITH PARTIAL CECECTOMY;  Surgeon: Ileana Roup, MD;  Location: [facility];  Service: General;  Laterality: N/A;   TONSILLECTOMY AND ADENOIDECTOMY      Current Outpatient Medications  Medication Instructions   acetaminophen (TYLENOL) 500 mg, Oral, Every 6 hours PRN   gentamicin ointment (GARAMYCIN) 0.1 % 1 Application, Daily PRN   levothyroxine (SYNTHROID) 50 MCG tablet 50 MCG MONDAY-WEDNESDAY-FRIDAY   levothyroxine (SYNTHROID) 75 MCG tablet TAKE 75MCG ON T, TH, SAT, AND SUNDAY   meclizine (ANTIVERT) 25 mg, Oral, At bedtime PRN   Vitamin D3 4,000 Units, Oral, Daily       Objective:   Physical Exam Skin:        BP 124/64   Pulse (!) 56   Temp 98.2 F (36.8 C) (Oral)   Resp 16   Ht '5\' 4"'$  (1.626 m)   Wt 148 lb 8 oz (67.4 kg)   SpO2 97%   BMI 25.49 kg/m  General:   Well developed, NAD, BMI noted. HEENT:  Normocephalic . Face symmetric, atraumatic  Skin: See graphic Neurologic:  alert & oriented X3.  Speech normal, gait appropriate for age and unassisted Psych--  Cognition and judgment appear intact.  Cooperative  with normal attention span and concentration.  Behavior appropriate. No anxious or depressed appearing.      Assessment     ASSESSMENT Hypothyroidism Goiter: s/p USs in [place], last ~ 2013, s/p bx ~ 2011 (-); [place] 09-2019 stable. Migraines, dx age 50s, ne recent XRs as off 11-2019 Vitreous detachment 2011 Rheumatic fever as a child Appendectomy, polypectomy 04-2022  PLAN: SK: Has multiple skin lesions, she is concerned about one on the right shoulder that has recently change.  Possibly a SK but needs to see dermatology, referral sent. Colon polyps: Colonoscopy 02-2022: 2 polyps, one was close to the appendiceal orifice, bx: Tubular adenoma, was referred to surgery, had an appendectomy September 2023, polyp was removed: Tubulovillous adenoma.  Other than mild incisional discomfort she feels very well.

## 2022-09-13 NOTE — Patient Instructions (Signed)
We are referring you to Dr. Allyson Sabal office. You can call them at [phone]

## 2022-10-06 ENCOUNTER — Ambulatory Visit (INDEPENDENT_AMBULATORY_CARE_PROVIDER_SITE_OTHER): Payer: Medicare HMO | Admitting: Internal Medicine

## 2022-10-06 ENCOUNTER — Encounter: Payer: Self-pay | Admitting: Internal Medicine

## 2022-10-06 VITALS — BP 126/70 | HR 60 | Temp 97.7°F | Resp 16 | Ht 64.0 in | Wt 148.1 lb

## 2022-10-06 DIAGNOSIS — Z0000 Encounter for general adult medical examination without abnormal findings: Secondary | ICD-10-CM

## 2022-10-06 DIAGNOSIS — E039 Hypothyroidism, unspecified: Secondary | ICD-10-CM

## 2022-10-06 LAB — BASIC METABOLIC PANEL
BUN: 15 mg/dL (ref 6–23)
CO2: 30 mEq/L (ref 19–32)
Calcium: 9.4 mg/dL (ref 8.4–10.5)
Chloride: 104 mEq/L (ref 96–112)
Creatinine, Ser: 0.86 mg/dL (ref 0.40–1.20)
GFR: 68.51 mL/min (ref >60.00)
Glucose, Bld: 79 mg/dL (ref 70–99)
Potassium: 4.5 mEq/L (ref 3.5–5.1)
Sodium: 143 mEq/L (ref 135–145)

## 2022-10-06 LAB — LIPID PANEL
Cholesterol: 224 mg/dL — ABNORMAL HIGH (ref 0–200)
HDL: 47.9 mg/dL (ref >39.00)
LDL Cholesterol: 143 mg/dL — ABNORMAL HIGH (ref 0–99)
NonHDL: 176.23
Total CHOL/HDL Ratio: 5
Triglycerides: 166 mg/dL — ABNORMAL HIGH (ref 0.0–149.0)
VLDL: 33.2 mg/dL (ref 0.0–40.0)

## 2022-10-06 LAB — CBC WITH DIFFERENTIAL/PLATELET
Basophils Absolute: 0.1 10*3/uL (ref 0.0–0.1)
Basophils Relative: 1.2 % (ref 0.0–3.0)
Eosinophils Absolute: 0.1 10*3/uL (ref 0.0–0.7)
Eosinophils Relative: 1.6 % (ref 0.0–5.0)
HCT: 41.3 % (ref 36.0–46.0)
Hemoglobin: 14 g/dL (ref 12.0–15.0)
Lymphocytes Relative: 27 % (ref 12.0–46.0)
Lymphs Abs: 1.8 10*3/uL (ref 0.7–4.0)
MCHC: 33.9 g/dL (ref 30.0–36.0)
MCV: 90.8 fl (ref 78.0–100.0)
Monocytes Absolute: 0.6 10*3/uL (ref 0.1–1.0)
Monocytes Relative: 9 % (ref 3.0–12.0)
Neutro Abs: 4 10*3/uL (ref 1.4–7.7)
Neutrophils Relative %: 61.2 % (ref 43.0–77.0)
Platelets: 260 10*3/uL (ref 150.0–400.0)
RBC: 4.55 Mil/uL (ref 3.87–5.11)
RDW: 12.8 % (ref 11.5–15.5)
WBC: 6.5 10*3/uL (ref 4.0–10.5)

## 2022-10-06 LAB — TSH: TSH: 0.93 u[IU]/mL (ref 0.35–5.50)

## 2022-10-06 NOTE — Patient Instructions (Addendum)
Vaccines I recommend: Covid booster A shingles shot Flu shot every fall   Please bring [place] a copy of your Healthcare Power of Attorney for your chart.      GO TO THE LAB : Get the blood work     [place], [place] Come back for   physical exam in 1 year, sooner if needed

## 2022-10-06 NOTE — Progress Notes (Signed)
Subjective:    Patient ID: Shannon Mendez, female    DOB: 1951-07-10, 72 y.o.   MRN: [id number]  DOS:  10/06/2022 Type of visit - description: cpx  Since the last office visit is doing well. From time to time has planta fasciitis symptoms, mild.   Review of Systems  Other than above, a 14 point review of systems is negative    Past Medical History:  Diagnosis Date   Arthritis    Fibrous papule of nose 08/08/2014   Benign   Goiter 08/15/2014   Heel spur, right    History of chicken pox    Hypothyroid    Migraine    Rheumatic fever    as a child   Vertigo    Vitreous detachment    2011    Past Surgical History:  Procedure Laterality Date   COLONOSCOPY  2009   in [place] exam prep good   LAPAROSCOPIC APPENDECTOMY N/A 04/29/2022   Procedure: APPENDECTOMY LAPAROSCOPIC WITH PARTIAL CECECTOMY;  Surgeon: Ileana Roup, MD;  Location: [facility];  Service: General;  Laterality: N/A;   TONSILLECTOMY AND ADENOIDECTOMY     Social History   Socioeconomic History   Marital status: Married    Spouse name: Not on file   Number of children: 2   Years of education: Not on file   Highest education level: Not on file  Occupational History   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  Tobacco Use   Smoking status: Never   Smokeless tobacco: Never  Vaping Use   Vaping Use: Never used  Substance and Sexual Activity   Alcohol use: Yes    Comment: Occasional-wine   Drug use: No   Sexual activity: Not on file  Other Topics Concern   Not on file  Social History Narrative   Moved from [place] 2014   Has a son ([place]) and a  daughter (she lives in [place]) they were born in the 17s       Social Determinants of Health   Financial Resource Strain: [place]  (06/14/2022)   Overall Financial Resource Strain (CARDIA)    Difficulty of Paying Living Expenses: Not hard at all  Food Insecurity: No [place] (06/14/2022)   Hunger Vital  Sign    Worried About Running Out of Food in the Last Year: Never true    [place] in the Last Year: Never true  Transportation Needs: No Transportation Needs (06/14/2022)   PRAPARE - Hydrologist (Medical): No    Lack of Transportation (Non-Medical): No  Physical Activity: Insufficiently Active (06/14/2022)   Exercise Vital Sign    Days of Exercise per Week: 5 days    Minutes of Exercise per Session: 20 min  Stress: No Stress Concern Present (06/14/2022)   [place]    Feeling of Stress : Not at all  Social Connections: Moderately Isolated (06/14/2022)   Social Connection and Isolation Panel [NHANES]    Frequency of Communication with Friends and Family: More than three times a week    Frequency of Social Gatherings with Friends and Family: More than three times a week    Attends Religious Services: Never    Marine scientist or Organizations: No    Attends Archivist Meetings: Never    Marital Status: Married  Human resources officer Violence: Not At Risk (06/14/2022)  Humiliation, Afraid, Rape, and Kick questionnaire    Fear of Current or Ex-Partner: No    Emotionally Abused: No    Physically Abused: No    Sexually Abused: No    Current Outpatient Medications  Medication Instructions   acetaminophen (TYLENOL) 500 mg, Oral, Every 6 hours PRN   gentamicin ointment (GARAMYCIN) 0.1 % 1 Application, Daily PRN   levothyroxine (SYNTHROID) 50 MCG tablet 50 MCG MONDAY-WEDNESDAY-FRIDAY   levothyroxine (SYNTHROID) 75 MCG tablet TAKE 75MCG ON T, TH, SAT, AND SUNDAY   Vitamin D3 4,000 Units, Oral, Daily       Objective:   Physical Exam BP 126/70   Pulse 60   Temp 97.7 F (36.5 C) (Oral)   Resp 16   Ht '5\' 4"'$  (1.626 m)   Wt 148 lb 2 oz (67.2 kg)   SpO2 97%   BMI 25.43 kg/m  General: Well developed, NAD, BMI noted Neck: No  thyromegaly  HEENT:  Normocephalic . Face  symmetric, atraumatic Lungs:  CTA B Normal respiratory effort, no intercostal retractions, no accessory muscle use. Heart: RRR,  no murmur.  Abdomen:  Not distended, soft, non-tender. No rebound or rigidity.   Lower extremities: no pretibial edema bilaterally  Skin: Exposed areas without rash. Not pale. Not jaundice Neurologic:  alert & oriented X3.  Speech normal, gait appropriate for age and unassisted Strength symmetric and appropriate for age.  Psych: Cognition and judgment appear intact.  Cooperative with normal attention span and concentration.  Behavior appropriate. No anxious or depressed appearing.     Assessment     ASSESSMENT Hypothyroidism Goiter: s/p USs in [place], last ~ 2013, s/p bx ~ 2011 (-); [place] 09-2019 stable. Migraines, dx age 23s, ne recent XRs as off 11-2019 Vitreous detachment 2011 Rheumatic fever as a child Appendectomy, polypectomy 04-2022  PLAN: Here for CPX Hypothyroidism: Good med compliance, check TSH. Request EKG, last was 2016.  EKG today: Sinus bradycardia, unchanged from previous Goiter: Clinical exam today is benign. Osteopenia: T score -1.9 (09-2017) , -1.8 (10-2021) rec  calcium and vitamin D.  Also recommend exercise Planta fasciitis: Occasional symptoms, encourage stretching. RTC 1 year

## 2022-10-06 NOTE — Assessment & Plan Note (Signed)
Here for CPX Hypothyroidism: Good med compliance, check TSH. Request EKG, last was 2016.  EKG today: Sinus bradycardia, unchanged from previous Goiter: Clinical exam today is benign. Osteopenia: T score -1.9 (09-2017) , -1.8 (10-2021) rec  calcium and vitamin D.  Also recommend exercise Planta fasciitis: Occasional symptoms, encourage stretching. RTC 1 year

## 2022-10-06 NOTE — Assessment & Plan Note (Signed)
-  Td 2016.  - PNM 23: 06/2019; PNM 13 :10/04/2020 -rec shingrix, covid booster and a flu shot q fall -CCS: Cscope 2009, no polyps; colonoscopy 02-2022.  Next per GI. - Female care: [facility] 11/2021 ([facility]), sees gyn, PAP 10/2020 ([facility]) -Lifestyle: Has started a walking program, recommended goal 3 hours a week.  Discussed importance of a healthy diet including cutting back on fats, more fruits, vegetables, olive oil etc. -Labs:    BMP FLP CBC TSH -Healthcare POA: See AVS

## 2022-10-25 ENCOUNTER — Ambulatory Visit: Payer: Medicare HMO | Admitting: Physician Assistant

## 2022-11-29 DIAGNOSIS — Z1231 Encounter for screening mammogram for malignant neoplasm of breast: Secondary | ICD-10-CM | POA: Diagnosis not present

## 2022-11-29 DIAGNOSIS — Z6826 Body mass index (BMI) 26.0-26.9, adult: Secondary | ICD-10-CM | POA: Diagnosis not present

## 2022-11-29 DIAGNOSIS — Z01419 Encounter for gynecological examination (general) (routine) without abnormal findings: Secondary | ICD-10-CM | POA: Diagnosis not present

## 2022-11-29 LAB — HM MAMMOGRAPHY

## 2022-12-13 DIAGNOSIS — D225 Melanocytic nevi of trunk: Secondary | ICD-10-CM | POA: Diagnosis not present

## 2022-12-13 DIAGNOSIS — L02821 Furuncle of head [any part, except face]: Secondary | ICD-10-CM | POA: Diagnosis not present

## 2022-12-13 DIAGNOSIS — Z1283 Encounter for screening for malignant neoplasm of skin: Secondary | ICD-10-CM | POA: Diagnosis not present

## 2022-12-13 DIAGNOSIS — B9689 Other specified bacterial agents as the cause of diseases classified elsewhere: Secondary | ICD-10-CM | POA: Diagnosis not present

## 2023-02-27 ENCOUNTER — Encounter: Payer: Self-pay | Admitting: Internal Medicine

## 2023-02-28 ENCOUNTER — Ambulatory Visit (INDEPENDENT_AMBULATORY_CARE_PROVIDER_SITE_OTHER): Payer: Medicare HMO | Admitting: Internal Medicine

## 2023-02-28 ENCOUNTER — Encounter: Payer: Self-pay | Admitting: Internal Medicine

## 2023-02-28 VITALS — BP 118/68 | HR 58 | Temp 98.1°F | Resp 16 | Ht 64.0 in | Wt 144.2 lb

## 2023-02-28 DIAGNOSIS — R399 Unspecified symptoms and signs involving the genitourinary system: Secondary | ICD-10-CM

## 2023-02-28 DIAGNOSIS — N390 Urinary tract infection, site not specified: Secondary | ICD-10-CM

## 2023-02-28 LAB — POC URINALSYSI DIPSTICK (AUTOMATED)
Bilirubin, UA: NEGATIVE
Glucose, UA: NEGATIVE
Ketones, UA: NEGATIVE
Nitrite, UA: NEGATIVE
Protein, UA: POSITIVE — AB
Spec Grav, UA: 1.02 (ref 1.010–1.025)
Urobilinogen, UA: 0.2 E.U./dL
pH, UA: 6 (ref 5.0–8.0)

## 2023-02-28 LAB — URINALYSIS, ROUTINE W REFLEX MICROSCOPIC
Ketones, ur: NEGATIVE
Nitrite: POSITIVE — AB
Specific Gravity, Urine: 1.025 (ref 1.000–1.030)
Total Protein, Urine: 30 — AB
Urine Glucose: NEGATIVE
Urobilinogen, UA: 0.2 (ref 0.0–1.0)
pH: 6 (ref 5.0–8.0)

## 2023-02-28 MED ORDER — SULFAMETHOXAZOLE-TRIMETHOPRIM 800-160 MG PO TABS: 1.0000 | ORAL_TABLET | Freq: Two times a day (BID) | ORAL | 0 refills | Status: DC

## 2023-02-28 NOTE — Patient Instructions (Signed)
Drink plenty of water  Take antibiotics for few days  Call if no gradually better

## 2023-02-28 NOTE — Progress Notes (Signed)
   Subjective:    Patient ID: Shannon Mendez, female    DOB: 1950/09/19, 72 y.o.   MRN: [id number]  DOS:  02/28/2023 Type of visit - description: acute, here with her husband  Symptoms started 2 days ago: Mild dysuria, small amount of blood  in the urine. Has not taken any OTCs other than Tylenol. Mild fatigue was noted as well.  No fever or chills No abdominal or flank pain No vaginal bleeding or discharge  Review of Systems See above   Past Medical History:  Diagnosis Date   Arthritis    Fibrous papule of nose 08/08/2014   Benign   Goiter 08/15/2014   Heel spur, right    History of chicken pox    Hypothyroid    Migraine    Rheumatic fever    as a child   Vertigo    Vitreous detachment    2011    Past Surgical History:  Procedure Laterality Date   COLONOSCOPY  2009   in [place]-normal exam prep good   LAPAROSCOPIC APPENDECTOMY N/A 04/29/2022   Procedure: APPENDECTOMY LAPAROSCOPIC WITH PARTIAL CECECTOMY;  Surgeon: Andria Meuse, MD;  Location: [facility];  Service: General;  Laterality: N/A;   TONSILLECTOMY AND ADENOIDECTOMY      Current Outpatient Medications  Medication Instructions   acetaminophen (TYLENOL) 500 mg, Oral, Every 6 hours PRN   gentamicin ointment (GARAMYCIN) 0.1 % 1 Application, Daily PRN   levothyroxine (SYNTHROID) 50 MCG tablet 50 MCG MONDAY-WEDNESDAY-FRIDAY   levothyroxine (SYNTHROID) 75 MCG tablet TAKE ON T, TH, SAT, AND SUNDAY   Vitamin D3 4,000 Units, Oral, Daily       Objective:   Physical Exam BP 118/68   Pulse (!) 58   Temp 98.1 F (36.7 C) (Oral)   Resp 16   Ht 5\' 4"  (1.626 m)   Wt 144 lb 4 oz (65.4 kg)   SpO2 96%   BMI 24.76 kg/m  General:   Well developed, NAD, BMI noted.  HEENT:  Normocephalic . Face symmetric, atraumatic Abdomen:  Not distended, soft, non-tender. No rebound or rigidity.  No CVA tenderness Skin: Not pale. Not jaundice Lower extremities: no pretibial edema bilaterally  Neurologic:  alert &  oriented X3.  Speech normal, gait appropriate for age and unassisted Psych--  Cognition and judgment appear intact.  Cooperative with normal attention span and concentration.  Behavior appropriate. No anxious or depressed appearing.     Assessment   ASSESSMENT Hypothyroidism Goiter: s/p USs in [place], last ~ 2013, s/p bx ~ 2011 (-); [place] 09-2019 stable. Migraines, dx age 69s, ne recent XRs as off 11-2019 Vitreous detachment 2011 Rheumatic fever as a child Appendectomy, polypectomy 04-2022  PLAN: UTI: Sxs and udip c/w UTI. Send UA, urine culture. Start Bactrim, avoid excessive sun exposure while on antibiotics, she is aware that we might need to change antibiotics based on culture. Drink plenty of fluids and call if not gradually better.

## 2023-03-01 LAB — URINE CULTURE
MICRO NUMBER:: 15235081
SPECIMEN QUALITY:: ADEQUATE

## 2023-03-01 NOTE — Assessment & Plan Note (Signed)
UTI: Sxs and udip c/w UTI. Send UA, urine culture. Start Bactrim, avoid excessive sun exposure while on antibiotics, she is aware that we might need to change antibiotics based on culture. Drink plenty of fluids and call if not gradually better.

## 2023-03-09 ENCOUNTER — Telehealth: Payer: Self-pay | Admitting: Internal Medicine

## 2023-03-09 NOTE — Telephone Encounter (Signed)
Needs appt

## 2023-03-09 NOTE — Telephone Encounter (Signed)
Pt called stating that she has had a fever for the past few days with weakness and cough. Pt took a COVID test that was negative but her fever is up to 101.9. Please Advise.

## 2023-03-10 ENCOUNTER — Ambulatory Visit: Payer: Medicare HMO | Admitting: Physician Assistant

## 2023-03-10 ENCOUNTER — Encounter: Payer: Self-pay | Admitting: Physician Assistant

## 2023-03-10 VITALS — BP 126/70 | HR 70 | Temp 98.6°F | Resp 20 | Ht 66.0 in | Wt 144.0 lb

## 2023-03-10 DIAGNOSIS — J069 Acute upper respiratory infection, unspecified: Secondary | ICD-10-CM

## 2023-03-10 NOTE — Progress Notes (Signed)
Established patient visit   Patient: Shannon Mendez   DOB: 12-23-1950   72 y.o. Female  MRN: [id number] Visit Date: 03/10/2023  Today's healthcare provider: Alfredia Ferguson, PA-C   Chief Complaint  Patient presents with   URI    /Cough, sore throat, and fever started on Monday and neg covid test 03/07/2023   Subjective    HPI   Pt reports cough, congestion, sore throat, fever with tmax 101.9 starting Monday x 5 days ago. Wanted to confirm she did not have a throat infection. Denies SOB, wheezing.  Tested negative for covid on a home test Tuesday.    Medications: Outpatient Medications Prior to Visit  Medication Sig   acetaminophen (TYLENOL) 500 MG tablet Take 500 mg by mouth every 6 (six) hours as needed for moderate pain.   Cholecalciferol (VITAMIN D3) 50 MCG (2000 UT) capsule Take 4,000 Units by mouth daily.   gentamicin ointment (GARAMYCIN) 0.1 % 1 Application daily as needed (nose bleeds).   levothyroxine (SYNTHROID) 50 MCG tablet 50 MCG MONDAY-WEDNESDAY-FRIDAY   levothyroxine (SYNTHROID) 75 MCG tablet TAKE ON T, TH, SAT, AND SUNDAY   sulfamethoxazole-trimethoprim (BACTRIM DS) 800-160 MG tablet Take 1 tablet by mouth 2 (two) times daily.   No facility-administered medications prior to visit.    Review of Systems  Constitutional:  Negative for fatigue and fever.  HENT:  Positive for congestion, sinus pressure and sore throat.   Respiratory:  Positive for cough. Negative for shortness of breath.   Cardiovascular:  Negative for chest pain and leg swelling.  Gastrointestinal:  Negative for abdominal pain.  Neurological:  Negative for dizziness and headaches.       Objective    BP 126/70 (BP Location: Left Arm, Patient Position: Sitting, Cuff Size: Normal)   Pulse 70   Temp 98.6 F (37 C) (Oral)   Resp 20   Ht 5\' 6"  (1.676 m)   Wt 144 lb (65.3 kg)   SpO2 98%   BMI 23.24 kg/m   Physical Exam Constitutional:      General: She is awake.      Appearance: She is well-developed.  HENT:     Head: Normocephalic.     Mouth/Throat:     Pharynx: Posterior oropharyngeal erythema present. No oropharyngeal exudate.  Eyes:     Conjunctiva/sclera: Conjunctivae normal.  Cardiovascular:     Rate and Rhythm: Normal rate and regular rhythm.     Heart sounds: Normal heart sounds.  Pulmonary:     Effort: Pulmonary effort is normal.     Breath sounds: Normal breath sounds. No wheezing or rales.  Skin:    General: Skin is warm.  Neurological:     Mental Status: She is alert and oriented to person, place, and time.  Psychiatric:        Attention and Perception: Attention normal.        Mood and Affect: Mood normal.        Speech: Speech normal.        Behavior: Behavior is cooperative.      No results found for any visits on 03/10/23.  Assessment & Plan     1. Upper respiratory tract infection, unspecified type Given symptoms, likely was a false negative covid test at home Offered testing in office, pt declined. Advised I would not treat differently from a URI of another virus, Recommending hydration, rest, ibuprofen/tylenol, mucinex.  If fever persists, SOB, weakness, return to offce  Return if symptoms worsen  or fail to improve.      I, Alfredia Ferguson, PA-C have reviewed all documentation for this visit. The documentation on  03/10/23   for the exam, diagnosis, procedures, and orders are all accurate and complete.    Alfredia Ferguson, PA-C  [facility] [phone] (phone) [phone] (fax)  [facility]

## 2023-04-16 ENCOUNTER — Other Ambulatory Visit: Payer: Self-pay | Admitting: Internal Medicine

## 2023-04-17 MED ORDER — LEVOTHYROXINE SODIUM 50 MCG PO TABS: ORAL_TABLET | ORAL | 1 refills | Status: DC

## 2023-06-09 ENCOUNTER — Telehealth: Payer: Self-pay | Admitting: Internal Medicine

## 2023-06-09 NOTE — Telephone Encounter (Signed)
Copied from CRM [id number]. Topic: Medicare AWV >> Jun 09, 2023 11:17 AM Payton Doughty wrote: Reason for CRM: Called LVM 06/09/2023 to schedule Annual Wellness Visit  Verlee Rossetti; Care Guide Ambulatory Clinical Support  l [facility] Direct Dial: [phone]

## 2023-06-19 ENCOUNTER — Telehealth: Payer: Self-pay | Admitting: Internal Medicine

## 2023-06-19 NOTE — Telephone Encounter (Signed)
Copied from CRM [id number]. Topic: Medicare AWV >> Jun 19, 2023  3:03 PM Payton Doughty wrote: Reason for CRM: Called 06/19/2023 to sched Annual Wellness Visit - NO VOICEMAIL  Verlee Rossetti; Care Guide Ambulatory Clinical Support [place] l [facility] Direct Dial: [phone]

## 2023-07-11 ENCOUNTER — Ambulatory Visit (INDEPENDENT_AMBULATORY_CARE_PROVIDER_SITE_OTHER): Payer: Medicare HMO

## 2023-07-11 VITALS — Ht 63.0 in | Wt 144.0 lb

## 2023-07-11 DIAGNOSIS — Z0000 Encounter for general adult medical examination without abnormal findings: Secondary | ICD-10-CM | POA: Diagnosis not present

## 2023-07-11 NOTE — Patient Instructions (Addendum)
Ms. Starnes , Thank you for taking time to come for your Medicare Wellness Visit. I appreciate your ongoing commitment to your health goals. Please review the following plan we discussed and let me know if I can assist you in the future.   Referrals/Orders/Follow-Ups/Clinician Recommendations:   This is a list of the screening recommended for you and due dates:  Health Maintenance  Topic Date Due   COVID-19 Vaccine (5 - 2023-24 season) 04/09/2023   Flu Shot  11/06/2023*   Mammogram  11/29/2023   Medicare Annual Wellness Visit  07/10/2024   DTaP/Tdap/Td vaccine (2 - Td or Tdap) 01/19/2025   Colon Cancer Screening  02/18/2029   Pneumonia Vaccine  Completed   DEXA scan (bone density measurement)  Completed   Hepatitis C Screening  Completed   HPV Vaccine  Aged Out   Zoster (Shingles) Vaccine  Discontinued  *Topic was postponed. The date shown is not the original due date.    Advanced directives: (Declined) Advance directive discussed with you today. Even though you declined this today, please call our office should you change your mind, and we can give you the proper paperwork for you to fill out.  Next Medicare Annual Wellness Visit scheduled for next year: Yes

## 2023-07-11 NOTE — Progress Notes (Signed)
Subjective:   Shannon Mendez is a 72 y.o. female who presents for Medicare Annual (Subsequent) preventive examination.  Visit Complete: Virtual I connected with  Birdena Crandall on 07/11/23 by a audio enabled telemedicine application and verified that I am speaking with the correct person using two identifiers.  Patient Location: Home  Provider Location: Home Office  I discussed the limitations of evaluation and management by telemedicine. The patient expressed understanding and agreed to proceed.  Vital Signs: Because this visit was a virtual/telehealth visit, some criteria may be missing or patient reported. Any vitals not documented were not able to be obtained and vitals that have been documented are patient reported.    Cardiac Risk Factors include: advanced age (>30men, >75 women)     Objective:    Today's Vitals   07/11/23 1434  Weight: 144 lb (65.3 kg)  Height: 5\' 3"  (1.6 m)   Body mass index is 25.51 kg/m.     07/11/2023    2:42 PM 06/14/2022    2:12 PM 04/29/2022   11:10 AM 04/19/2022    2:01 PM 04/08/2021    2:30 PM  Advanced Directives  Does Patient Have a Medical Advance Directive? No Yes No No No  Type of Special educational needs teacher of [place];Living will     Copy of Healthcare Power of Attorney in Chart?  No - copy requested     Would patient like information on creating a medical advance directive? No - Patient declined  No - Patient declined  Yes (MAU/Ambulatory/Procedural Areas - Information given)    Current Medications (verified) Outpatient Encounter Medications as of 07/11/2023  Medication Sig   acetaminophen (TYLENOL) 500 MG tablet Take 500 mg by mouth every 6 (six) hours as needed for moderate pain.   Cholecalciferol (VITAMIN D3) 50 MCG (2000 UT) capsule Take 4,000 Units by mouth daily.   gentamicin ointment (GARAMYCIN) 0.1 % 1 Application daily as needed (nose bleeds).   levothyroxine (SYNTHROID) 50 MCG tablet 50 MCG MONDAY-WEDNESDAY-FRIDAY    levothyroxine (SYNTHROID) 75 MCG tablet TAKE ON T, TH, SAT, AND SUNDAY   sulfamethoxazole-trimethoprim (BACTRIM DS) 800-160 MG tablet Take 1 tablet by mouth 2 (two) times daily.   No facility-administered encounter medications on file as of 07/11/2023.    Allergies (verified) Penicillins and Amoxicillin   History: Past Medical History:  Diagnosis Date   Arthritis    Fibrous papule of nose 08/08/2014   Benign   Goiter 08/15/2014   Heel spur, right    History of chicken pox    Hypothyroid    Migraine    Rheumatic fever    as a child   Vertigo    Vitreous detachment    2011   Past Surgical History:  Procedure Laterality Date   COLONOSCOPY  2009   in [place]-normal exam prep good   LAPAROSCOPIC APPENDECTOMY N/A 04/29/2022   Procedure: APPENDECTOMY LAPAROSCOPIC WITH PARTIAL CECECTOMY;  Surgeon: Andria Meuse, MD;  Location: [facility];  Service: General;  Laterality: N/A;   TONSILLECTOMY AND ADENOIDECTOMY     Family History  Problem Relation Age of Onset   Ovarian cancer Mother        in her 77s   Lung cancer Father        smoker   Aneurysm Father        Aorta A   Diabetes Other        GM ?   Colon cancer Neg Hx    Cervical cancer  Neg Hx    Breast cancer Neg Hx    CAD Neg Hx    Social History   Socioeconomic History   Marital status: Married    Spouse name: Not on file   Number of children: 2   Years of education: Not on file   Highest education level: Not on file  Occupational History   Occupation: retired from an Audiological scientist firm in [place]    Comment: Government social research officer  Tobacco Use   Smoking status: Never   Smokeless tobacco: Never  Vaping Use   Vaping status: Never Used  Substance and Sexual Activity   Alcohol use: Yes    Comment: Occasional-wine   Drug use: No   Sexual activity: Not on file  Other Topics Concern   Not on file  Social History Narrative   Moved from [place] 2014   Has a son ([place]) and a  daughter (she lives in [place]) they were  born in the 66s       Social Determinants of Health   Financial Resource Strain: Low Risk  (07/11/2023)   Overall Financial Resource Strain (CARDIA)    Difficulty of Paying Living Expenses: Not hard at all  Food Insecurity: No Food Insecurity (07/11/2023)   Hunger Vital Sign    Worried About Running Out of Food in the Last Year: Never true    Ran Out of Food in the Last Year: Never true  Transportation Needs: No Transportation Needs (07/11/2023)   PRAPARE - Administrator, Civil Service (Medical): No    Lack of Transportation (Non-Medical): No  Physical Activity: Insufficiently Active (07/11/2023)   Exercise Vital Sign    Days of Exercise per Week: 3 days    Minutes of Exercise per Session: 30 min  Stress: No Stress Concern Present (07/11/2023)   [employer] of Occupational Health - Occupational Stress Questionnaire    Feeling of Stress : Not at all  Social Connections: Moderately Isolated (07/11/2023)   Social Connection and Isolation Panel [NHANES]    Frequency of Communication with Friends and Family: More than three times a week    Frequency of Social Gatherings with Friends and Family: More than three times a week    Attends Religious Services: Never    Database administrator or Organizations: No    Attends Engineer, structural: Never    Marital Status: Married    Tobacco Counseling Counseling given: Not Answered   Clinical Intake:  Pre-visit preparation completed: Yes  Pain : No/denies pain     BMI - recorded: 25.51 Nutritional Status: BMI 25 -29 Overweight Nutritional Risks: None Diabetes: No  How often do you need to have someone help you when you read instructions, pamphlets, or other written materials from your doctor or pharmacy?: 1 - Never  Interpreter Needed?: No  Information entered by :: Theresa Mulligan LPN   Activities of Daily Living    07/11/2023    2:41 PM  In your present state of health, do you have any difficulty  performing the following activities:  Hearing? 0  Vision? 0  Difficulty concentrating or making decisions? 0  Walking or climbing stairs? 0  Dressing or bathing? 0  Doing errands, shopping? 0  Preparing Food and eating ? N  Using the Toilet? N  In the past six months, have you accidently leaked urine? N  Do you have problems with loss of bowel control? N  Managing your Medications? N  Managing your Finances? N  Housekeeping or managing your Housekeeping? N    Patient Care Team: Wanda Plump, MD as PCP - General (Internal Medicine) Myer Peer, Victorino Dike, PA-C (Inactive) as Physician Assistant (Dermatology) Mitchel Honour, DO as Consulting Physician (Obstetrics and Gynecology)  Indicate any recent Medical Services you may have received from other than [facility] providers in the past year (date may be approximate).     Assessment:   This is a routine wellness examination for [place].  Hearing/Vision screen Hearing Screening - Comments:: Denies hearing difficulties   Vision Screening - Comments:: Wears rx glasses - up to date with routine eye exams with  [facility]   Goals Addressed               This Visit's Progress     Increase physical activity (pt-stated)        Continue to eat healthy.       Depression Screen    07/11/2023    2:40 PM 02/28/2023   10:00 AM 10/06/2022    9:14 AM 09/13/2022   10:53 AM 06/14/2022    2:11 PM 02/01/2022   10:44 AM 10/04/2021    1:02 PM  PHQ 2/9 Scores  PHQ - 2 Score 0 0 0 0 0 0 0  PHQ- 9 Score   1        Fall Risk    07/11/2023    2:41 PM 02/28/2023    9:59 AM 10/06/2022    8:46 AM 09/13/2022   10:53 AM 06/14/2022    2:13 PM  Fall Risk   Falls in the past year? 0 0 0 0 0  Number falls in past yr: 0 0 0 0 0  Injury with Fall? 0 0 0 0 0  Risk for fall due to : No Fall Risks    Impaired vision  Risk for fall due to: Comment     balance when vertigo comes on  Follow up Falls prevention discussed Falls evaluation completed Falls  evaluation completed Falls evaluation completed Falls prevention discussed    MEDICARE RISK AT HOME: Medicare Risk at Home Any stairs in or around the home?: Yes If so, are there any without handrails?: No Home free of loose throw rugs in walkways, pet beds, electrical cords, etc?: Yes Adequate lighting in your home to reduce risk of falls?: Yes Life alert?: No Use of a cane, walker or w/c?: No Grab bars in the bathroom?: Yes Shower chair or bench in shower?: Yes Elevated toilet seat or a handicapped toilet?: Yes  TIMED UP AND GO:  Was the test performed?  No    Cognitive Function:        07/11/2023    2:43 PM 06/14/2022    2:15 PM  6CIT Screen  What Year? 0 points 0 points  What month? 0 points 0 points  What time? 0 points 0 points  Count back from 20 0 points 0 points  Months in reverse 0 points 0 points  Repeat phrase 0 points 0 points  Total Score 0 points 0 points    Immunizations Immunization History  Administered Date(s) Administered   Fluad Quad(high Dose 65+) 05/23/2019, 07/20/2020   PFIZER(Purple Top)SARS-COV-2 Vaccination 10/03/2019, 10/23/2019, 06/15/2020   Pfizer Covid-19 Vaccine Bivalent Booster 60yrs & up 06/01/2021   Pneumococcal Conjugate-13 10/01/2020   Pneumococcal Polysaccharide-23 06/25/2019   Tdap 01/20/2015    TDAP status: Up to date  Flu Vaccine status: Due, Education has been provided regarding the importance of this vaccine. Advised may receive  this vaccine at local pharmacy or Health Dept. Aware to provide a copy of the vaccination record if obtained from local pharmacy or Health Dept. Verbalized acceptance and understanding.  Pneumococcal vaccine status: Up to date  Covid-19 vaccine status: Declined, Education has been provided regarding the importance of this vaccine but patient still declined. Advised may receive this vaccine at local pharmacy or Health Dept.or vaccine clinic. Aware to provide a copy of the vaccination record if  obtained from local pharmacy or Health Dept. Verbalized acceptance and understanding.    Screening Tests Health Maintenance  Topic Date Due   COVID-19 Vaccine (5 - 2023-24 season) 04/09/2023   INFLUENZA VACCINE  11/06/2023 (Originally 03/09/2023)   MAMMOGRAM  11/29/2023   Medicare Annual Wellness (AWV)  07/10/2024   DTaP/Tdap/Td (2 - Td or Tdap) 01/19/2025   Colonoscopy  02/18/2029   Pneumonia Vaccine 71+ Years old  Completed   DEXA SCAN  Completed   Hepatitis C Screening  Completed   HPV VACCINES  Aged Out   Zoster Vaccines- Shingrix  Discontinued    Health Maintenance  Health Maintenance Due  Topic Date Due   COVID-19 Vaccine (5 - 2023-24 season) 04/09/2023    Colorectal cancer screening: Type of screening: Colonoscopy. Completed 02/18/22. Repeat every 7 years  Mammogram status: Completed 11/29/22. Repeat every year  Bone Density status: Completed 10/11/21. Results reflect: Bone density results: OSTEOPENIA. Repeat every   years.    Additional Screening:  Hepatitis C Screening: does qualify; Completed 08/18/17  Vision Screening: Recommended annual ophthalmology exams for early detection of glaucoma and other disorders of the eye. Is the patient up to date with their annual eye exam?  Yes  Who is the provider or what is the name of the office in which the patient attends annual eye exams? [facility] If pt is not established with a provider, would they like to be referred to a provider to establish care? No .   Dental Screening: Recommended annual dental exams for proper oral hygiene   Community Resource Referral / Chronic Care Management:  CRR required this visit?  No   CCM required this visit?  No     Plan:     I have personally reviewed and noted the following in the patient's chart:   Medical and social history Use of alcohol, tobacco or illicit drugs  Current medications and supplements including opioid prescriptions. Patient is not currently taking  opioid prescriptions. Functional ability and status Nutritional status Physical activity Advanced directives List of other physicians Hospitalizations, surgeries, and ER visits in previous 12 months Vitals Screenings to include cognitive, depression, and falls Referrals and appointments  In addition, I have reviewed and discussed with patient certain preventive protocols, quality metrics, and best practice recommendations. A written personalized care plan for preventive services as well as general preventive health recommendations were provided to patient.     Tillie Rung, LPN   95/01/[id number]   After Visit Summary: (MyChart) Due to this being a telephonic visit, the after visit summary with patients personalized plan was offered to patient via MyChart   Nurse Notes: None

## 2023-08-23 ENCOUNTER — Encounter: Payer: Self-pay | Admitting: Internal Medicine

## 2023-08-23 ENCOUNTER — Ambulatory Visit: Payer: Medicare HMO | Admitting: Internal Medicine

## 2023-08-23 VITALS — BP 120/70 | HR 61 | Temp 97.8°F | Resp 16 | Ht 64.0 in | Wt 145.5 lb

## 2023-08-23 DIAGNOSIS — R0789 Other chest pain: Secondary | ICD-10-CM

## 2023-08-23 NOTE — Progress Notes (Signed)
   Subjective:    Patient ID: Shannon Mendez, female    DOB: 05-Nov-1950, 73 y.o.   MRN: [id number]  DOS:  08/23/2023 Type of visit - description: Acute, here with her husband  About 10 days ago developed back pain located at the left anterior chest wall, subsequently the pain also radiated to the left upper quadrant. Described as ache, burning, stinging sensation, on and off coming every few minutes. The symptoms stopped about 3 days ago.  Denies any fever or chills. Felt slightly nauseous without vomiting x 1. No diarrhea or blood in the stools No LUTS No rash No cough  Review of Systems See above   Past Medical History:  Diagnosis Date   Arthritis    Fibrous papule of nose 08/08/2014   Benign   Goiter 08/15/2014   Heel spur, right    History of chicken pox    Hypothyroid    Migraine    Rheumatic fever    as a child   Vertigo    Vitreous detachment    2011    Past Surgical History:  Procedure Laterality Date   COLONOSCOPY  2009   in [place]-normal exam prep good   LAPAROSCOPIC APPENDECTOMY N/A 04/29/2022   Procedure: APPENDECTOMY LAPAROSCOPIC WITH PARTIAL CECECTOMY;  Surgeon: Andria Meuse, MD;  Location: [facility];  Service: General;  Laterality: N/A;   TONSILLECTOMY AND ADENOIDECTOMY      Current Outpatient Medications  Medication Instructions   acetaminophen (TYLENOL) 500 mg, Every 6 hours PRN   gentamicin ointment (GARAMYCIN) 0.1 % 1 Application, Daily PRN   levothyroxine (SYNTHROID) 50 MCG tablet 50 MCG MONDAY-WEDNESDAY-FRIDAY   levothyroxine (SYNTHROID) 75 MCG tablet TAKE ON T, TH, SAT, AND SUNDAY   sulfamethoxazole-trimethoprim (BACTRIM DS) 800-160 MG tablet 1 tablet, Oral, 2 times daily   Vitamin D3 4,000 Units, Daily       Objective:   Physical Exam Skin:         Comments: Area of perceived pain    BP 120/70   Pulse 61   Temp 97.8 F (36.6 C) (Oral)   Resp 16   Ht 5\' 4"  (1.626 m)   Wt 145 lb 8 oz (66 kg)   SpO2 97%   BMI  24.98 kg/m  General:   Well developed, NAD, BMI noted.  HEENT:  Normocephalic . Face symmetric, atraumatic Chest wall: No TTP Abdomen:  Not distended, soft, non-tender. No rebound or rigidity.  No organomegaly. Skin: Skin at the back and abdomen examined, no blisters, no rash. Lower extremities: no pretibial edema bilaterally  Neurologic:  alert & oriented X3.  Speech normal, gait appropriate for age and unassisted Psych--  Cognition and judgment appear intact.  Cooperative with normal attention span and concentration.  Behavior appropriate. No anxious or depressed appearing.     Assessment    Problem list: Hypothyroidism Goiter: s/p USs in [place], last ~ 2013, s/p bx ~ 2011 (-); [place] 09-2019 stable. Migraines, dx age 26s, ne recent XRs as off 11-2019 Vitreous detachment 2011 Rheumatic fever as a child Appendectomy, polypectomy 04-2022  PLAN: Chest wall pain: Neuropathic by description, ROS benign, abdominal exam benign.   Mild Zoster sine herpete?  Resolved intercostal neuralgia? We agreed on observation for now.  She will let me know if she develop any rash in the next few days. If the pain resurface, and there is no rash, she could try Salonpas or we could try gabapentin Follow-up for CPX in few weeks

## 2023-08-24 NOTE — Assessment & Plan Note (Signed)
Chest wall pain: Neuropathic by description, ROS benign, abdominal exam benign.   Mild Zoster sine herpete?  Resolved intercostal neuralgia? We agreed on observation for now.  She will let me know if she develop any rash in the next few days. If the pain resurface, and there is no rash, she could try Salonpas or we could try gabapentin Follow-up for CPX in few weeks

## 2023-09-05 ENCOUNTER — Encounter: Payer: Self-pay | Admitting: Physician Assistant

## 2023-09-05 ENCOUNTER — Ambulatory Visit (HOSPITAL_BASED_OUTPATIENT_CLINIC_OR_DEPARTMENT_OTHER)
Admission: RE | Admit: 2023-09-05 | Discharge: 2023-09-05 | Disposition: A | Discharge status: Home / Self Care | Payer: Medicare HMO | Source: Ambulatory Visit | Attending: Physician Assistant | Admitting: Physician Assistant

## 2023-09-05 ENCOUNTER — Ambulatory Visit (INDEPENDENT_AMBULATORY_CARE_PROVIDER_SITE_OTHER): Payer: Medicare HMO | Admitting: Physician Assistant

## 2023-09-05 VITALS — BP 118/77 | HR 70 | Temp 98.1°F | Ht 64.0 in | Wt 145.0 lb

## 2023-09-05 DIAGNOSIS — R051 Acute cough: Secondary | ICD-10-CM | POA: Insufficient documentation

## 2023-09-05 DIAGNOSIS — R0689 Other abnormalities of breathing: Secondary | ICD-10-CM

## 2023-09-05 DIAGNOSIS — R059 Cough, unspecified: Secondary | ICD-10-CM | POA: Diagnosis not present

## 2023-09-05 DIAGNOSIS — R0989 Other specified symptoms and signs involving the circulatory and respiratory systems: Secondary | ICD-10-CM | POA: Diagnosis not present

## 2023-09-05 MED ORDER — PROMETHAZINE-DM 6.25-15 MG/5ML PO SYRP: 5.0000 mL | ORAL_SOLUTION | Freq: Four times a day (QID) | ORAL | 0 refills | Status: DC | PRN

## 2023-09-05 NOTE — Progress Notes (Signed)
Established patient visit   Patient: Shannon Mendez   DOB: July 28, 1951   73 y.o. Female  MRN: [id number] Visit Date: 09/05/2023  Today's healthcare provider: Alfredia Ferguson, PA-C   Chief Complaint  Patient presents with   Cough    Started 1/20 Cough started out dry- fever the first day- no fever since. Low energy OTC- nyquil- cold and flu meds. Nothing has helped   Subjective     Pt reports she had a cold, including a cough, slight fever that started 1/20 and has slightly improved, but she reports a persistent cough, fatigue. Using nyquil otc. Denies SOB.  Medications: Outpatient Medications Prior to Visit  Medication Sig   acetaminophen (TYLENOL) 500 MG tablet Take 500 mg by mouth every 6 (six) hours as needed for moderate pain.   Cholecalciferol (VITAMIN D3) 50 MCG (2000 UT) capsule Take 4,000 Units by mouth daily.   gentamicin ointment (GARAMYCIN) 0.1 % 1 Application daily as needed (nose bleeds).   levothyroxine (SYNTHROID) 50 MCG tablet 50 MCG MONDAY-WEDNESDAY-FRIDAY   levothyroxine (SYNTHROID) 75 MCG tablet TAKE ON T, TH, SAT, AND SUNDAY   [DISCONTINUED] sulfamethoxazole-trimethoprim (BACTRIM DS) 800-160 MG tablet Take 1 tablet by mouth 2 (two) times daily.   No facility-administered medications prior to visit.    Review of Systems  Constitutional:  Positive for fatigue. Negative for fever.  Respiratory:  Positive for cough. Negative for shortness of breath.   Cardiovascular:  Negative for chest pain and leg swelling.  Gastrointestinal:  Negative for abdominal pain.  Neurological:  Negative for dizziness and headaches.       Objective    BP 118/77   Pulse 70   Temp 98.1 F (36.7 C) (Oral)   Ht 5\' 4"  (1.626 m)   Wt 145 lb (65.8 kg)   SpO2 97%   BMI 24.89 kg/m    Physical Exam Constitutional:      General: She is awake.     Appearance: She is well-developed.  HENT:     Head: Normocephalic.  Eyes:     Conjunctiva/sclera: Conjunctivae  normal.  Cardiovascular:     Rate and Rhythm: Normal rate and regular rhythm.     Heart sounds: Normal heart sounds.  Pulmonary:     Effort: Pulmonary effort is normal.     Breath sounds: Examination of the right-lower field reveals rhonchi. Wheezing and rhonchi present.     Comments: Crackles L upper lobe Skin:    General: Skin is warm.  Neurological:     Mental Status: She is alert and oriented to person, place, and time.  Psychiatric:        Attention and Perception: Attention normal.        Mood and Affect: Mood normal.        Speech: Speech normal.        Behavior: Behavior is cooperative.      No results found for any visits on 09/05/23.  Assessment & Plan    Acute cough -     Promethazine-DM; Take 5 mLs by mouth 4 (four) times daily as needed.  Dispense: 118 mL; Refill: 0 -     DG Chest 2 View  Abnormal breath sounds -     DG Chest 2 View   Recommending chest xray r/o pneumonia Rx cough syrup, advised to take tylenol, antihistamines otc, hydrate and rest. Will tx as indicated on chest xray , if clear likely a viral bronchitis  Return if symptoms worsen or  fail to improve.       Alfredia Ferguson, PA-C  [facility] at [facility] [phone] (phone) [phone] (fax)  [facility]

## 2023-09-06 ENCOUNTER — Telehealth: Payer: Self-pay

## 2023-09-06 NOTE — Telephone Encounter (Signed)
  Raiyah-- negative chest xray . If you still have symptoms through the weekend, let us know!  Written by Alfredia Ferguson, PA-C on 09/05/2023  4:45 PM EST

## 2023-09-06 NOTE — Telephone Encounter (Signed)
Copied from CRM [id number]. Topic: Clinical - Lab/Test Results >> Sep 06, 2023  9:31 AM Kathryne Eriksson wrote: Reason for CRM: Results From Chest X-Ray >> Sep 06, 2023  9:33 AM Kathryne Eriksson wrote: Patient is requesting a call back with the results from her chest x-ray. Patient's call back number is [phone].

## 2023-09-06 NOTE — Telephone Encounter (Signed)
Pt seen by Lillia Abed yesterday

## 2023-10-10 ENCOUNTER — Encounter: Payer: Self-pay | Admitting: Internal Medicine

## 2023-10-10 ENCOUNTER — Ambulatory Visit (INDEPENDENT_AMBULATORY_CARE_PROVIDER_SITE_OTHER): Payer: Medicare HMO | Admitting: Internal Medicine

## 2023-10-10 VITALS — BP 116/78 | HR 51 | Temp 97.7°F | Resp 16 | Ht 64.0 in | Wt 144.4 lb

## 2023-10-10 DIAGNOSIS — E039 Hypothyroidism, unspecified: Secondary | ICD-10-CM | POA: Diagnosis not present

## 2023-10-10 DIAGNOSIS — E785 Hyperlipidemia, unspecified: Secondary | ICD-10-CM | POA: Diagnosis not present

## 2023-10-10 DIAGNOSIS — Z0000 Encounter for general adult medical examination without abnormal findings: Secondary | ICD-10-CM

## 2023-10-10 LAB — CBC WITH DIFFERENTIAL/PLATELET
Basophils Absolute: 0.1 10*3/uL (ref 0.0–0.1)
Basophils Relative: 1.2 % (ref 0.0–3.0)
Eosinophils Absolute: 0.1 10*3/uL (ref 0.0–0.7)
Eosinophils Relative: 1 % (ref 0.0–5.0)
HCT: 43.1 % (ref 36.0–46.0)
Hemoglobin: 14.3 g/dL (ref 12.0–15.0)
Lymphocytes Relative: 23.5 % (ref 12.0–46.0)
Lymphs Abs: 1.5 10*3/uL (ref 0.7–4.0)
MCHC: 33.3 g/dL (ref 30.0–36.0)
MCV: 93.2 fl (ref 78.0–100.0)
Monocytes Absolute: 0.5 10*3/uL (ref 0.1–1.0)
Monocytes Relative: 7.6 % (ref 3.0–12.0)
Neutro Abs: 4.4 10*3/uL (ref 1.4–7.7)
Neutrophils Relative %: 66.7 % (ref 43.0–77.0)
Platelets: 280 10*3/uL (ref 150.0–400.0)
RBC: 4.62 Mil/uL (ref 3.87–5.11)
RDW: 13.5 % (ref 11.5–15.5)
WBC: 6.5 10*3/uL (ref 4.0–10.5)

## 2023-10-10 LAB — COMPREHENSIVE METABOLIC PANEL
ALT: 18 U/L (ref 0–35)
AST: 18 U/L (ref 0–37)
Albumin: 4.3 g/dL (ref 3.5–5.2)
Alkaline Phosphatase: 91 U/L (ref 39–117)
BUN: 18 mg/dL (ref 6–23)
CO2: 31 meq/L (ref 19–32)
Calcium: 9.1 mg/dL (ref 8.4–10.5)
Chloride: 104 meq/L (ref 96–112)
Creatinine, Ser: 0.84 mg/dL (ref 0.40–1.20)
GFR: 69.98 mL/min (ref >60.00)
Glucose, Bld: 88 mg/dL (ref 70–99)
Potassium: 4.6 meq/L (ref 3.5–5.1)
Sodium: 140 meq/L (ref 135–145)
Total Bilirubin: 0.9 mg/dL (ref 0.2–1.2)
Total Protein: 6.9 g/dL (ref 6.0–8.3)

## 2023-10-10 LAB — LIPID PANEL
Cholesterol: 213 mg/dL — ABNORMAL HIGH (ref 0–200)
HDL: 49.8 mg/dL (ref >39.00)
LDL Cholesterol: 129 mg/dL — ABNORMAL HIGH (ref 0–99)
NonHDL: 163.55
Total CHOL/HDL Ratio: 4
Triglycerides: 175 mg/dL — ABNORMAL HIGH (ref 0.0–149.0)
VLDL: 35 mg/dL (ref 0.0–40.0)

## 2023-10-10 LAB — TSH: TSH: 1.07 u[IU]/mL (ref 0.35–5.50)

## 2023-10-10 NOTE — Patient Instructions (Signed)
   GO TO THE LAB : Get the blood work     Please go to the front desk and schedule the following: A follow-up in 6 months       "Health Care Power of attorney" (Also know as a  "Living will" or  Advance care planning documents)  If you already have a living will or healthcare power of attorney, is recommended you bring the copy to be scanned in your chart.   The document will be available to all the doctors you see in the system.  If you are over 72 y/o and don't have the document, please read:  Advance care planning is a process that supports adults in  understanding and sharing their preferences regarding future medical care.  The patient's preferences are recorded in documents called Advance Directives and the can be modified at any time while the patient is in full mental capacity.     More information at: [email or web address]

## 2023-10-10 NOTE — Progress Notes (Unsigned)
 Subjective:    Patient ID: Shannon Mendez, female    DOB: 03-04-1951, 73 y.o.   MRN: [id number]  DOS:  10/10/2023 Type of visit - description: cpx, here with her husband.  In general feels well.  Had a respiratory illness and cough for a while, is almost asymptomatic now  Review of Systems  Other than above, a 14 point review of systems is negative     Past Medical History:  Diagnosis Date   Arthritis    Fibrous papule of nose 08/08/2014   Benign   Goiter 08/15/2014   Heel spur, right    History of chicken pox    Hypothyroid    Migraine    Rheumatic fever    as a child   Vertigo    Vitreous detachment    2011    Past Surgical History:  Procedure Laterality Date   COLONOSCOPY  2009   in [place]-normal exam prep good   LAPAROSCOPIC APPENDECTOMY N/A 04/29/2022   Procedure: APPENDECTOMY LAPAROSCOPIC WITH PARTIAL CECECTOMY;  Surgeon: Andria Meuse, MD;  Location: [facility];  Service: General;  Laterality: N/A;   TONSILLECTOMY AND ADENOIDECTOMY     Social History   Socioeconomic History   Marital status: Married    Spouse name: Not on file   Number of children: 2   Years of education: Not on file   Highest education level: Not on file  Occupational History   Occupation: retired from an [employer] firm in [place]    Comment: office assistant  Tobacco Use   Smoking status: Never   Smokeless tobacco: Never  Vaping Use   Vaping status: Never Used  Substance and Sexual Activity   Alcohol use: Yes    Comment: Occasional-wine   Drug use: No   Sexual activity: Not on file  Other Topics Concern   Not on file  Social History Narrative   Moved from [place] 2014   Has a son ([place]) and a  daughter (she lives in [place]) they were born in the 58s       Social Drivers of Health   Financial Resource Strain: Low Risk  (07/11/2023)   Overall Financial Resource Strain (CARDIA)    Difficulty of Paying Living Expenses: Not hard at all  Food Insecurity: No Food Insecurity  (07/11/2023)   Hunger Vital Sign    Worried About Running Out of Food in the Last Year: Never true    Ran Out of Food in the Last Year: Never true  Transportation Needs: No Transportation Needs (07/11/2023)   PRAPARE - Administrator, Civil Service (Medical): No    Lack of Transportation (Non-Medical): No  Physical Activity: Insufficiently Active (07/11/2023)   Exercise Vital Sign    Days of Exercise per Week: 3 days    Minutes of Exercise per Session: 30 min  Stress: No Stress Concern Present (07/11/2023)   [employer] of Occupational Health - Occupational Stress Questionnaire    Feeling of Stress : Not at all  Social Connections: Moderately Isolated (07/11/2023)   Social Connection and Isolation Panel [NHANES]    Frequency of Communication with Friends and Family: More than three times a week    Frequency of Social Gatherings with Friends and Family: More than three times a week    Attends Religious Services: Never    Database administrator or Organizations: No    Attends Banker Meetings: Never    Marital Status: Married  Catering manager  Violence: Not At Risk (07/11/2023)   Humiliation, Afraid, Rape, and Kick questionnaire    Fear of Current or Ex-Partner: No    Emotionally Abused: No    Physically Abused: No    Sexually Abused: No    Current Outpatient Medications  Medication Instructions   acetaminophen (TYLENOL) 500 mg, Every 6 hours PRN   gentamicin ointment (GARAMYCIN) 0.1 % 1 Application, Daily PRN   levothyroxine (SYNTHROID) 50 MCG tablet 50 MCG MONDAY-WEDNESDAY-FRIDAY   levothyroxine (SYNTHROID) 75 MCG tablet TAKE ON T, TH, SAT, AND SUNDAY   Vitamin D3 4,000 Units, Daily       Objective:   Physical Exam BP 116/78   Pulse (!) 51   Temp 97.7 F (36.5 C) (Oral)   Resp 16   Ht 5\' 4"  (1.626 m)   Wt 144 lb 6 oz (65.5 kg)   SpO2 98%   BMI 24.78 kg/m  General: Well developed, NAD, BMI noted Neck: No  thyromegaly  HEENT:   Normocephalic . Face symmetric, atraumatic Lungs:  CTA B Normal respiratory effort, no intercostal retractions, no accessory muscle use. Heart: RRR,  no murmur.  Abdomen:  Not distended, soft, non-tender. No rebound or rigidity.   Lower extremities: no pretibial edema bilaterally  Skin: Exposed areas without rash. Not pale. Not jaundice Neurologic:  alert & oriented X3.  Speech normal, gait appropriate for age and unassisted Strength symmetric and appropriate for age.  Psych: Cognition and judgment appear intact.  Cooperative with normal attention span and concentration.  Behavior appropriate. No anxious or depressed appearing.     Assessment    Problem list: Hypothyroidism Goiter: s/p USs in [place], last ~ 2013, s/p bx ~ 2011 (-); [place] 09-2019 stable. Migraines, dx age 43s, ne recent XRs as off 11-2019 Vitreous detachment 2011 Rheumatic fever as a child Appendectomy, polypectomy 04-2022  PLAN: Here for CPX -Td 2016.  - PNM 23: 06/2019; PNM 13 :10/04/2020 - Vaccines I recommend: Flu shot every fall, COVID booster, Shingrix. Pros>cons  -CCS: Cscope 2009, no polyps; colonoscopy 02-2022.  Next per GI. - Female care: MMG 11/2022 ([facility]), sees gyn, PAP 10/2020 ([facility]) -Lifestyle:doing well, more vegetables, knows she needs to exercise more  -Labs:    CMP FLP CBC TSH -Healthcare POA: See AVS Also discussed the following: Hypothyroidism: Good med compliance, check TSH Dyslipidemia: Based on last FLP, she qualifies for statin.   Benefit of statins and side effects discussed with patient.  Fasting labs today, states she might be inclined to take medications. Osteopenia: Encouraged physical activity, vitamin D, consider DEXA next year. RTC 6 months

## 2023-10-11 ENCOUNTER — Encounter: Payer: Self-pay | Admitting: Internal Medicine

## 2023-10-11 NOTE — Assessment & Plan Note (Signed)
 Here for CPX   Also discussed the following: Hypothyroidism: Good med compliance, check TSH Dyslipidemia: Based on last FLP, she qualifies for statin.   Benefit of statins and side effects discussed with patient.  Fasting labs today, states she might be inclined to take medications. Osteopenia: Encouraged physical activity, vitamin D, consider DEXA next year. RTC 6 months

## 2023-10-11 NOTE — Assessment & Plan Note (Signed)
 Here for CPX -Td 2016.  - PNM 23: 06/2019; PNM 13 :10/04/2020 - Vaccines I recommend: Flu shot every fall, COVID booster, Shingrix. Pros>cons  -CCS: Cscope 2009, no polyps; colonoscopy 02-2022.  Next per GI. - Female care: [facility]G 11/2022 ([facility]), sees gyn, PAP 10/2020 ([facility]) -Lifestyle:doing well, more vegetables, knows she needs to exercise more  -Labs:    CMP FLP CBC TSH -Healthcare POA: See AVS

## 2023-10-12 ENCOUNTER — Encounter: Payer: Self-pay | Admitting: Internal Medicine

## 2023-11-22 DIAGNOSIS — L821 Other seborrheic keratosis: Secondary | ICD-10-CM | POA: Diagnosis not present

## 2023-11-22 DIAGNOSIS — D225 Melanocytic nevi of trunk: Secondary | ICD-10-CM | POA: Diagnosis not present

## 2023-11-22 DIAGNOSIS — L820 Inflamed seborrheic keratosis: Secondary | ICD-10-CM | POA: Diagnosis not present

## 2024-01-03 DIAGNOSIS — Z124 Encounter for screening for malignant neoplasm of cervix: Secondary | ICD-10-CM | POA: Diagnosis not present

## 2024-01-03 DIAGNOSIS — Z1231 Encounter for screening mammogram for malignant neoplasm of breast: Secondary | ICD-10-CM | POA: Diagnosis not present

## 2024-01-03 LAB — HM MAMMOGRAPHY

## 2024-01-15 ENCOUNTER — Telehealth: Payer: Self-pay

## 2024-01-15 NOTE — Telephone Encounter (Signed)
 Garamycin prescribed by ENT back in 2023. If she is having increasing or severe nosebleeds: Needs to go to ENT. If bleeding is sporadic: Recommend to use Vaseline in the nostrils at bedtime.

## 2024-01-15 NOTE — Telephone Encounter (Signed)
 Copied from CRM [id number]. Topic: Clinical - Medication Question >> Jan 15, 2024 11:43 AM Deaijah H wrote: Reason for CRM: Patient called in wanting to know if Dr. Neomi Banks nurse may give her a call regarding prescription gentamicin ointment (GARAMYCIN) 0.1 %. Please call [phone]

## 2024-01-15 NOTE — Telephone Encounter (Signed)
Spoke w/ Pt- informed of PCP recommendations. Pt verbalized understanding.  

## 2024-01-15 NOTE — Telephone Encounter (Signed)
**Note De-identified  [place] Obfuscation** Please advise 

## 2024-03-03 ENCOUNTER — Other Ambulatory Visit: Payer: Self-pay | Admitting: Internal Medicine

## 2024-04-12 ENCOUNTER — Ambulatory Visit: Admitting: Internal Medicine

## 2024-04-23 ENCOUNTER — Ambulatory Visit (INDEPENDENT_AMBULATORY_CARE_PROVIDER_SITE_OTHER): Admitting: Internal Medicine

## 2024-04-23 ENCOUNTER — Encounter: Payer: Self-pay | Admitting: Internal Medicine

## 2024-04-23 VITALS — BP 118/86 | HR 67 | Temp 97.6°F | Resp 16 | Ht 64.0 in | Wt 148.5 lb

## 2024-04-23 DIAGNOSIS — E785 Hyperlipidemia, unspecified: Secondary | ICD-10-CM | POA: Insufficient documentation

## 2024-04-23 DIAGNOSIS — Z8669 Personal history of other diseases of the nervous system and sense organs: Secondary | ICD-10-CM

## 2024-04-23 DIAGNOSIS — E039 Hypothyroidism, unspecified: Secondary | ICD-10-CM | POA: Diagnosis not present

## 2024-04-23 NOTE — Progress Notes (Signed)
   Subjective:    Patient ID: Shannon Mendez, female    DOB: 11/09/1950, 73 y.o.   MRN: [id number]  DOS:  04/23/2024 Type of visit - description: Follow-up  Here with her husband. Doing well. She does have right knee pain mostly when she goes up and down stairs, not many problems when she walks on flat terrain.   Review of Systems See above   Past Medical History:  Diagnosis Date   Arthritis    Fibrous papule of nose 08/08/2014   Benign   Goiter 08/15/2014   Heel spur, right    History of chicken pox    Hypothyroid    Migraine    Rheumatic fever    as a child   Vertigo    Vitreous detachment    2011    Past Surgical History:  Procedure Laterality Date   COLONOSCOPY  2009   in [place] -normal exam prep good   LAPAROSCOPIC APPENDECTOMY N/A 04/29/2022   Procedure: APPENDECTOMY LAPAROSCOPIC WITH PARTIAL CECECTOMY;  Surgeon: Teresa Lonni HERO, MD;  Location: [facility];  Service: General;  Laterality: N/A;   TONSILLECTOMY AND ADENOIDECTOMY      Current Outpatient Medications  Medication Instructions   acetaminophen  (TYLENOL ) 500 mg, Every 6 hours PRN   levothyroxine  (SYNTHROID ) 50 MCG tablet 50 MCG MONDAY-WEDNESDAY-FRIDAY   levothyroxine  (SYNTHROID ) 75 MCG tablet TAKE ON T, TH, SAT, AND SUNDAY   Vitamin D3 4,000 Units, Daily       Objective:   Physical Exam BP 118/86   Pulse 67   Temp 97.6 F (36.4 C) (Oral)   Resp 16   Ht 5' 4 (1.626 m)   Wt 148 lb 8 oz (67.4 kg)   SpO2 97%   BMI 25.49 kg/m  General:   Well developed, NAD, BMI noted. HEENT:  Normocephalic . Face symmetric, atraumatic Lungs:  CTA B Normal respiratory effort, no intercostal retractions, no accessory muscle use. Heart: RRR,  no murmur.  Lower extremities: no pretibial edema bilaterally  Skin: Not pale. Not jaundice Neurologic:  alert & oriented X3.  Speech normal, gait appropriate for age and unassisted Psych--  Cognition and judgment appear intact.  Cooperative with normal  attention span and concentration.  Behavior appropriate. No anxious or depressed appearing.      Assessment   Problem list: Hypothyroidism Goiter: s/p USs in [place], last ~ 2013, s/p bx ~ 2011 (-); [place]  09-2019 stable. Migraines, dx age 66s, ne recent XRs as off 11-2019 Vitreous detachment 2011 Rheumatic fever as a child Appendectomy, polypectomy 04-2022  PLAN: Hypothyroidism: On Synthroid  75 mcg and 50 mcg alternating days.  Check a TSH. Dyslipidemia: The patient is reluctant to take statins, last LDL improved to 129, on lifestyle modification. CV risk still slightly elevated, she is aware that statistically she would benefit from statins.  She will think about it.  Dietary advice provided. Migraines: History of, no symptoms in years. Vaccine advised: Flu shot, COVID booster, RTC 6 months CPX

## 2024-04-23 NOTE — Assessment & Plan Note (Signed)
 Hypothyroidism: On Synthroid  75 mcg and 50 mcg alternating days.  Check a TSH. Dyslipidemia: The patient is reluctant to take statins, last LDL improved to 129, on lifestyle modification. CV risk still slightly elevated, she is aware that statistically she would benefit from statins.  She will think about it.  Dietary advice provided. Migraines: History of, no symptoms in years. Vaccine advised: Flu shot, COVID booster, RTC 6 months CPX

## 2024-04-23 NOTE — Patient Instructions (Signed)
 Recommend to get a flu shot and a COVID-vaccine this fall  GO TO THE LAB :  Get the blood work   Your results will be posted on MyChart with my comments  Go to the front desk for the checkout Please make an appointment for a physical exam by March 2026

## 2024-04-24 ENCOUNTER — Ambulatory Visit: Payer: Self-pay | Admitting: Internal Medicine

## 2024-04-24 LAB — TSH: TSH: 0.76 u[IU]/mL (ref 0.35–5.50)

## 2024-04-26 ENCOUNTER — Encounter: Payer: Self-pay | Admitting: Internal Medicine

## 2024-05-09 ENCOUNTER — Ambulatory Visit: Payer: Self-pay

## 2024-05-09 NOTE — Telephone Encounter (Signed)
 FYI Only or Action Required?: FYI only for provider.  Patient was last seen in primary care on 04/23/2024 by Amon Aloysius BRAVO, MD.  Called Nurse Triage reporting Diarrhea.  Symptoms began several days ago.  Interventions attempted: Nothing.  Symptoms are: gradually worsening.  Triage Disposition: See PCP When Office is Open (Within 3 Days)  Patient/caregiver understands and will follow disposition?: Yes          Copied from CRM [id number]. Topic: Clinical - Red Word Triage >> May 09, 2024  3:44 PM Berneda FALCON wrote: Red Word that prompted transfer to Nurse Triage: Over the weekend-made pumpkin bread-had some slices of the bread-beginning Monday-had severe diarrhea, but the stool is light colored and followed by water. This happens a few time each day since Monday.  No other symptoms, no loss of appetite Reason for Disposition  [1] MILD diarrhea (e.g., 1-3 or more stools than normal in past 24 hours) AND [2] present >  7 days  (Exception: Chronic diarrhea that is not worse.)  Answer Assessment - Initial Assessment Questions 1. DIARRHEA SEVERITY: How bad is the diarrhea? How many more stools have you had in the past 24 hours than normal?      3 episodes  2. ONSET: When did the diarrhea begin?      Monday  3. STOOL DESCRIPTION:  How loose or watery is the diarrhea? What is the stool color? Is there any blood or mucous in the stool?     Light beige color with small pieces of stool  4. VOMITING: Are you also vomiting? If Yes, ask: How many times in the past 24 hours?      Denies  5. ABDOMEN PAIN: Are you having any abdomen pain? If Yes, ask: What does it feel like? (e.g., crampy, dull, intermittent, constant)      Denies  6. ABDOMEN PAIN SEVERITY: If present, ask: How bad is the pain?  (e.g., Scale 1-10; mild, moderate, or severe)     Denies pain  7. ORAL INTAKE: If vomiting, Have you been able to drink liquids? How much liquids have you had in the past 24  hours?     Yes can drink normally  8. HYDRATION: Any signs of dehydration? (e.g., dry mouth [not just dry lips], too weak to stand, dizziness, new weight loss) When did you last urinate?     Denies  111. OTHER SYMPTOMS: Do you have any other symptoms? (e.g., fever, blood in stool)       Denies  Protocols used: Diarrhea-A-AH

## 2024-05-13 ENCOUNTER — Encounter: Payer: Self-pay | Admitting: Internal Medicine

## 2024-05-13 ENCOUNTER — Ambulatory Visit: Admitting: Internal Medicine

## 2024-05-13 VITALS — BP 116/78 | HR 58 | Temp 98.0°F | Resp 16 | Ht 64.0 in | Wt 147.2 lb

## 2024-05-13 DIAGNOSIS — R197 Diarrhea, unspecified: Secondary | ICD-10-CM | POA: Diagnosis not present

## 2024-05-13 NOTE — Assessment & Plan Note (Signed)
 Acute diarrhea The diarrhea episode began after consuming homemade pumpkin bread and lasted for four to five days, characterized by frequent, light-colored, watery stools without associated fever, chills, or abdominal pain. The symptoms have resolved. Diarrhea probably related to high fiber content of pumpkin. Her husband experienced a similar but shorter episode  - Advise to avoid consuming large quantities of pumpkin in the future. - Encourage maintaining hydration and monitoring for any recurrence of symptoms. - Instruct to report any recurrence of diarrhea or new symptoms.

## 2024-05-13 NOTE — Progress Notes (Signed)
 Subjective:    Patient ID: Shannon Mendez, female    DOB: [birth year], 73 y.o.   MRN: [id number]  DOS:  05/13/2024 Discussed the use of AI scribe software for clinical note transcription with the patient, who gave verbal consent to proceed.  History of Present Illness Acute, here with her husband  Acute diarrhea - Onset a week ago after consuming homemade pumpkin bread - Duration four to five days - Stools light in color, watery, occurring three to four times daily - No blood in stools - No associated abdominal pain, fever, chills, nausea, or vomiting - No dizziness upon standing - No recent antibiotic use - Maintained hydration with water, with reminders from husband - Symptoms resolved over the weekend with return to normal bowel movements   Review of Systems See above   Past Medical History:  Diagnosis Date   Arthritis    Fibrous papule of nose 08/08/2014   Benign   Goiter 08/15/2014   Heel spur, right    History of chicken pox    Hypothyroid    Migraine    Rheumatic fever    as a child   Vertigo    Vitreous detachment    2011    Past Surgical History:  Procedure Laterality Date   COLONOSCOPY  2009   in [place] -normal exam prep good   LAPAROSCOPIC APPENDECTOMY N/A 04/29/2022   Procedure: APPENDECTOMY LAPAROSCOPIC WITH PARTIAL CECECTOMY;  Surgeon: Teresa Lonni HERO, MD;  Location: [facility];  Service: General;  Laterality: N/A;   TONSILLECTOMY AND ADENOIDECTOMY      Current Outpatient Medications  Medication Instructions   acetaminophen  (TYLENOL ) 500 mg, Every 6 hours PRN   levothyroxine  (SYNTHROID ) 50 MCG tablet 50 MCG MONDAY-WEDNESDAY-FRIDAY   levothyroxine  (SYNTHROID ) 75 MCG tablet TAKE ON T, TH, SAT, AND SUNDAY   Vitamin D3 4,000 Units, Daily       Objective:   Physical Exam BP 116/78   Pulse (!) 58   Temp 98 F (36.7 C) (Oral)   Resp 16   Ht 5' 4 (1.626 m)   Wt 147 lb 4 oz (66.8 kg)   SpO2 98%   BMI 25.28 kg/m  General:   Well  developed, NAD, BMI noted.  HEENT:  Normocephalic . Face symmetric, atraumatic Abdomen:  Not distended, soft, non-tender. No rebound or rigidity.   Skin: Not pale. Not jaundice Lower extremities: no pretibial edema bilaterally  Neurologic:  alert & oriented X3.  Speech normal, gait appropriate for age and unassisted Psych--  Cognition and judgment appear intact.  Cooperative with normal attention span and concentration.  Behavior appropriate. No anxious or depressed appearing.     Assessment     Problem list: Hypothyroidism Goiter: s/p USs in [place], last ~ 2013, s/p bx ~ 2011 (-); [place]  09-2019 stable. Migraines, dx age 80s, ne recent XRs as off 11-2019 Vitreous detachment 2011 Rheumatic fever as a child Appendectomy, polypectomy 04-2022  PLAN: Assessment and Plan Assessment & Plan Acute diarrhea The diarrhea episode began after consuming homemade pumpkin bread and lasted for four to five days, characterized by frequent, light-colored, watery stools without associated fever, chills, or abdominal pain. The symptoms have resolved. Diarrhea probably related to high fiber content of pumpkin. Her husband experienced a similar but shorter episode  - Advise to avoid consuming large quantities of pumpkin in the future. - Encourage maintaining hydration and monitoring for any recurrence of symptoms. - Instruct to report any recurrence of diarrhea or new symptoms.

## 2024-06-07 ENCOUNTER — Other Ambulatory Visit: Payer: Self-pay | Admitting: Internal Medicine

## 2024-06-07 MED ORDER — LEVOTHYROXINE SODIUM 75 MCG PO TABS: ORAL_TABLET | ORAL | 1 refills | Status: AC

## 2024-06-07 MED ORDER — LEVOTHYROXINE SODIUM 50 MCG PO TABS: ORAL_TABLET | ORAL | 1 refills | Status: AC

## 2024-06-07 NOTE — Telephone Encounter (Signed)
 Copied from CRM #[id number]. Topic: Clinical - Medication Refill >> Jun 07, 2024  8:43 AM Burnard DEL wrote: Medication: levothyroxine  (SYNTHROID ) 75 MCG tablet  Has the patient contacted their pharmacy? Yes (Agent: If no, request that the patient contact the pharmacy for the refill. If patient does not wish to contact the pharmacy document the reason why and proceed with request.) (Agent: If yes, when and what did the pharmacy advise?)  This is the patient's preferred pharmacy:  [id number] [place], [place] - [address] [place] [address] Phone: [phone] Fax: [phone]  Is this the correct pharmacy for this prescription? Yes If no, delete pharmacy and type the correct one.   Has the prescription been filled recently? No  Is the patient out of the medication? NO(few left)  Has the patient been seen for an appointment in the last year OR does the patient have an upcoming appointment? Yes  Can we respond through MyChart? Yes  Agent: Please be advised that Rx refills may take up to 3 business days. We ask that you follow-up with your pharmacy.

## 2024-08-14 ENCOUNTER — Telehealth: Payer: Self-pay | Admitting: Internal Medicine

## 2024-08-14 NOTE — Telephone Encounter (Signed)
 Copied from CRM [id number]. Topic: Medicare AWV >> Aug 14, 2024  2:30 PM Nathanel DEL wrote: Called LVM 08/14/2024 to sched AWVS. Please schedule AWVS in office.  Nathanel Paschal; Care Guide Ambulatory Clinical Support [facility], [facility] Direct Dial: [phone]

## 2024-10-30 ENCOUNTER — Encounter: Admitting: Internal Medicine
